# Patient Record
Sex: Female | Born: 1965 | Race: Black or African American | Hispanic: No | Smoking: Never smoker
Health system: Southern US, Community
[De-identification: ages and names within clinical notes are randomized; demographics above are authoritative.]

## PROBLEM LIST (undated history)

## (undated) DIAGNOSIS — M419 Scoliosis, unspecified: Secondary | ICD-10-CM

## (undated) DIAGNOSIS — R87629 Unspecified abnormal cytological findings in specimens from vagina: Secondary | ICD-10-CM

## (undated) HISTORY — DX: Unspecified abnormal cytological findings in specimens from vagina: R87.629

---

## 2008-06-02 HISTORY — PX: ESSURE TUBAL LIGATION: SUR464

## 2009-12-09 ENCOUNTER — Emergency Department (HOSPITAL_COMMUNITY): Admission: EM | Admit: 2009-12-09 | Discharge: 2009-12-09 | Payer: Self-pay | Admitting: Emergency Medicine

## 2010-01-02 ENCOUNTER — Emergency Department (HOSPITAL_COMMUNITY)
Admission: EM | Admit: 2010-01-02 | Discharge: 2010-01-02 | Payer: Self-pay | Source: Home / Self Care | Admitting: Emergency Medicine

## 2010-05-04 ENCOUNTER — Emergency Department (HOSPITAL_COMMUNITY): Payer: Medicaid Other

## 2010-05-04 ENCOUNTER — Emergency Department (HOSPITAL_COMMUNITY)
Admission: EM | Admit: 2010-05-04 | Discharge: 2010-05-04 | Disposition: A | Discharge status: Home / Self Care | Payer: Medicaid Other | Attending: Emergency Medicine | Admitting: Emergency Medicine

## 2010-05-04 DIAGNOSIS — R071 Chest pain on breathing: Secondary | ICD-10-CM | POA: Insufficient documentation

## 2010-05-04 DIAGNOSIS — R0602 Shortness of breath: Secondary | ICD-10-CM | POA: Insufficient documentation

## 2010-05-04 DIAGNOSIS — R42 Dizziness and giddiness: Secondary | ICD-10-CM | POA: Insufficient documentation

## 2010-05-04 DIAGNOSIS — R350 Frequency of micturition: Secondary | ICD-10-CM | POA: Insufficient documentation

## 2010-05-04 DIAGNOSIS — R51 Headache: Secondary | ICD-10-CM | POA: Insufficient documentation

## 2010-05-04 LAB — URINALYSIS, ROUTINE W REFLEX MICROSCOPIC
Bilirubin Urine: NEGATIVE
Glucose, UA: NEGATIVE mg/dL
Hgb urine dipstick: NEGATIVE
Ketones, ur: NEGATIVE mg/dL
pH: 6 (ref 5.0–8.0)

## 2010-05-04 LAB — CBC
HCT: 39.9 % (ref 36.0–46.0)
Hemoglobin: 12.8 g/dL (ref 12.0–15.0)
MCH: 24.8 pg — ABNORMAL LOW (ref 26.0–34.0)
MCHC: 32.1 g/dL (ref 30.0–36.0)
RDW: 13.1 % (ref 11.5–15.5)

## 2010-05-04 LAB — DIFFERENTIAL
Basophils Relative: 0 % (ref 0–1)
Eosinophils Relative: 1 % (ref 0–5)
Lymphocytes Relative: 23 % (ref 12–46)
Monocytes Absolute: 0.6 10*3/uL (ref 0.1–1.0)
Monocytes Relative: 6 % (ref 3–12)
Neutro Abs: 6.6 10*3/uL (ref 1.7–7.7)

## 2010-05-04 LAB — BASIC METABOLIC PANEL
BUN: 12 mg/dL (ref 6–23)
CO2: 29 mEq/L (ref 19–32)
Calcium: 9.8 mg/dL (ref 8.4–10.5)
Chloride: 102 mEq/L (ref 96–112)
Creatinine, Ser: 0.77 mg/dL (ref 0.4–1.2)
Glucose, Bld: 93 mg/dL (ref 70–99)

## 2010-05-04 MED ORDER — IOHEXOL 300 MG/ML  SOLN
100.0000 mL | Freq: Once | INTRAMUSCULAR | Status: AC | PRN
  Administered 2010-05-04: 100 mL via INTRAVENOUS

## 2011-05-06 ENCOUNTER — Other Ambulatory Visit (HOSPITAL_COMMUNITY): Payer: Self-pay | Admitting: Obstetrics

## 2011-05-06 DIAGNOSIS — Z1231 Encounter for screening mammogram for malignant neoplasm of breast: Secondary | ICD-10-CM

## 2011-06-02 ENCOUNTER — Ambulatory Visit (HOSPITAL_COMMUNITY)
Admission: RE | Admit: 2011-06-02 | Discharge: 2011-06-02 | Disposition: A | Discharge status: Home / Self Care | Payer: Medicaid Other | Source: Ambulatory Visit | Attending: Obstetrics | Admitting: Obstetrics

## 2011-06-02 DIAGNOSIS — Z1231 Encounter for screening mammogram for malignant neoplasm of breast: Secondary | ICD-10-CM

## 2011-08-31 ENCOUNTER — Ambulatory Visit: Payer: Medicaid Other | Attending: Orthopedic Surgery | Admitting: Rehabilitative and Restorative Service Providers"

## 2011-08-31 DIAGNOSIS — M545 Low back pain, unspecified: Secondary | ICD-10-CM | POA: Insufficient documentation

## 2011-08-31 DIAGNOSIS — M542 Cervicalgia: Secondary | ICD-10-CM | POA: Insufficient documentation

## 2011-08-31 DIAGNOSIS — IMO0001 Reserved for inherently not codable concepts without codable children: Secondary | ICD-10-CM | POA: Insufficient documentation

## 2011-09-14 ENCOUNTER — Encounter: Payer: Medicaid Other | Admitting: Physical Therapy

## 2011-09-28 ENCOUNTER — Ambulatory Visit: Payer: Medicaid Other | Attending: Orthopedic Surgery | Admitting: Rehabilitative and Restorative Service Providers"

## 2011-09-28 DIAGNOSIS — M542 Cervicalgia: Secondary | ICD-10-CM | POA: Insufficient documentation

## 2011-09-28 DIAGNOSIS — M545 Low back pain, unspecified: Secondary | ICD-10-CM | POA: Insufficient documentation

## 2011-09-28 DIAGNOSIS — IMO0001 Reserved for inherently not codable concepts without codable children: Secondary | ICD-10-CM | POA: Insufficient documentation

## 2011-10-07 ENCOUNTER — Ambulatory Visit: Payer: Medicaid Other | Attending: Orthopedic Surgery | Admitting: Rehabilitative and Restorative Service Providers"

## 2011-10-07 DIAGNOSIS — M545 Low back pain, unspecified: Secondary | ICD-10-CM | POA: Insufficient documentation

## 2011-10-07 DIAGNOSIS — IMO0001 Reserved for inherently not codable concepts without codable children: Secondary | ICD-10-CM | POA: Insufficient documentation

## 2011-10-07 DIAGNOSIS — M542 Cervicalgia: Secondary | ICD-10-CM | POA: Insufficient documentation

## 2011-10-19 ENCOUNTER — Ambulatory Visit: Payer: Medicaid Other | Admitting: Rehabilitative and Restorative Service Providers"

## 2012-04-29 ENCOUNTER — Other Ambulatory Visit (HOSPITAL_COMMUNITY): Payer: Self-pay | Admitting: Obstetrics

## 2012-04-29 ENCOUNTER — Other Ambulatory Visit (HOSPITAL_COMMUNITY): Payer: Self-pay | Admitting: Family Medicine

## 2012-04-29 DIAGNOSIS — Z1231 Encounter for screening mammogram for malignant neoplasm of breast: Secondary | ICD-10-CM

## 2012-06-03 ENCOUNTER — Ambulatory Visit (HOSPITAL_COMMUNITY)
Admission: RE | Admit: 2012-06-03 | Discharge: 2012-06-03 | Disposition: A | Discharge status: Home / Self Care | Payer: Medicaid Other | Source: Ambulatory Visit | Attending: Obstetrics | Admitting: Obstetrics

## 2012-06-03 DIAGNOSIS — Z1231 Encounter for screening mammogram for malignant neoplasm of breast: Secondary | ICD-10-CM

## 2012-06-07 ENCOUNTER — Other Ambulatory Visit: Payer: Self-pay | Admitting: Family Medicine

## 2012-06-07 ENCOUNTER — Other Ambulatory Visit: Payer: Self-pay | Admitting: Obstetrics

## 2012-06-07 DIAGNOSIS — R928 Other abnormal and inconclusive findings on diagnostic imaging of breast: Secondary | ICD-10-CM

## 2012-06-17 ENCOUNTER — Ambulatory Visit
Admission: RE | Admit: 2012-06-17 | Discharge: 2012-06-17 | Disposition: A | Discharge status: Home / Self Care | Payer: Medicaid Other | Source: Ambulatory Visit | Attending: Obstetrics | Admitting: Obstetrics

## 2012-06-17 DIAGNOSIS — R928 Other abnormal and inconclusive findings on diagnostic imaging of breast: Secondary | ICD-10-CM

## 2013-04-05 ENCOUNTER — Emergency Department (HOSPITAL_COMMUNITY)
Admission: EM | Admit: 2013-04-05 | Discharge: 2013-04-05 | Disposition: A | Discharge status: Home / Self Care | Payer: Medicaid Other | Attending: Emergency Medicine | Admitting: Emergency Medicine

## 2013-04-05 ENCOUNTER — Emergency Department (HOSPITAL_COMMUNITY): Payer: Medicaid Other

## 2013-04-05 ENCOUNTER — Encounter (HOSPITAL_COMMUNITY): Payer: Self-pay | Admitting: Emergency Medicine

## 2013-04-05 DIAGNOSIS — R209 Unspecified disturbances of skin sensation: Secondary | ICD-10-CM | POA: Insufficient documentation

## 2013-04-05 DIAGNOSIS — M791 Myalgia, unspecified site: Secondary | ICD-10-CM

## 2013-04-05 DIAGNOSIS — R112 Nausea with vomiting, unspecified: Secondary | ICD-10-CM | POA: Insufficient documentation

## 2013-04-05 DIAGNOSIS — M5412 Radiculopathy, cervical region: Secondary | ICD-10-CM | POA: Insufficient documentation

## 2013-04-05 DIAGNOSIS — IMO0001 Reserved for inherently not codable concepts without codable children: Secondary | ICD-10-CM | POA: Insufficient documentation

## 2013-04-05 DIAGNOSIS — R197 Diarrhea, unspecified: Secondary | ICD-10-CM | POA: Insufficient documentation

## 2013-04-05 DIAGNOSIS — Z791 Long term (current) use of non-steroidal anti-inflammatories (NSAID): Secondary | ICD-10-CM | POA: Insufficient documentation

## 2013-04-05 LAB — COMPREHENSIVE METABOLIC PANEL
ALK PHOS: 57 U/L (ref 39–117)
ALT: 18 U/L (ref 0–35)
AST: 18 U/L (ref 0–37)
Albumin: 4.1 g/dL (ref 3.5–5.2)
BUN: 13 mg/dL (ref 6–23)
CO2: 25 meq/L (ref 19–32)
Calcium: 9.7 mg/dL (ref 8.4–10.5)
Chloride: 102 mEq/L (ref 96–112)
Creatinine, Ser: 0.83 mg/dL (ref 0.50–1.10)
GFR calc non Af Amer: 84 mL/min — ABNORMAL LOW (ref >90)
GLUCOSE: 96 mg/dL (ref 70–99)
POTASSIUM: 4.1 meq/L (ref 3.7–5.3)
SODIUM: 141 meq/L (ref 137–147)
TOTAL PROTEIN: 8.4 g/dL — AB (ref 6.0–8.3)
Total Bilirubin: 0.6 mg/dL (ref 0.3–1.2)

## 2013-04-05 LAB — I-STAT TROPONIN, ED
TROPONIN I, POC: 0 ng/mL (ref 0.00–0.08)
Troponin i, poc: 0 ng/mL (ref 0.00–0.08)

## 2013-04-05 LAB — CBC WITH DIFFERENTIAL/PLATELET
BASOS ABS: 0 10*3/uL (ref 0.0–0.1)
Basophils Relative: 0 % (ref 0–1)
EOS ABS: 0.2 10*3/uL (ref 0.0–0.7)
Eosinophils Relative: 3 % (ref 0–5)
HCT: 40.6 % (ref 36.0–46.0)
Hemoglobin: 13.4 g/dL (ref 12.0–15.0)
LYMPHS ABS: 1.7 10*3/uL (ref 0.7–4.0)
LYMPHS PCT: 35 % (ref 12–46)
MCH: 25.3 pg — ABNORMAL LOW (ref 26.0–34.0)
MCHC: 33 g/dL (ref 30.0–36.0)
MCV: 76.6 fL — ABNORMAL LOW (ref 78.0–100.0)
Monocytes Absolute: 0.5 10*3/uL (ref 0.1–1.0)
Monocytes Relative: 9 % (ref 3–12)
NEUTROS PCT: 53 % (ref 43–77)
Neutro Abs: 2.6 10*3/uL (ref 1.7–7.7)
PLATELETS: 240 10*3/uL (ref 150–400)
RBC: 5.3 MIL/uL — AB (ref 3.87–5.11)
RDW: 13 % (ref 11.5–15.5)
WBC: 5 10*3/uL (ref 4.0–10.5)

## 2013-04-05 MED ORDER — KETOROLAC TROMETHAMINE 60 MG/2ML IM SOLN
60.0000 mg | Freq: Once | INTRAMUSCULAR | Status: AC
  Administered 2013-04-05: 60 mg via INTRAMUSCULAR
  Filled 2013-04-05: qty 2

## 2013-04-05 MED ORDER — CYCLOBENZAPRINE HCL 10 MG PO TABS
5.0000 mg | ORAL_TABLET | Freq: Once | ORAL | Status: AC
  Administered 2013-04-05: 5 mg via ORAL
  Filled 2013-04-05: qty 1

## 2013-04-05 MED ORDER — MELOXICAM 7.5 MG PO TABS: 7.5000 mg | ORAL_TABLET | Freq: Every day | ORAL | Status: DC

## 2013-04-05 MED ORDER — CYCLOBENZAPRINE HCL 10 MG PO TABS: 10.0000 mg | ORAL_TABLET | Freq: Two times a day (BID) | ORAL | Status: DC | PRN

## 2013-04-05 NOTE — Progress Notes (Signed)
VASCULAR LAB PRELIMINARY  PRELIMINARY  PRELIMINARY  PRELIMINARY  Left upper extremity venous duplex completed.    Preliminary report:  Left:  No evidence of DVT or superficial thrombosis.    Murdis Flitton, RVT 04/05/2013, 2:46 PM

## 2013-04-05 NOTE — ED Notes (Signed)
Left arm pain numbness  ( no cp) x 2 days hurts to lift vomited on Monday  No sob  Some neck pain denies injury

## 2013-04-05 NOTE — ED Provider Notes (Signed)
CSN: [id number]     Arrival date & time 04/05/13  4098 History   First MD Initiated Contact with Patient 04/05/13 1003     Chief Complaint  Patient presents with  . Extremity Weakness     (Consider location/radiation/quality/duration/timing/severity/associated sxs/prior Treatment) The history is provided by the patient. No language interpreter was used.  Jillian Casey is a 48 year old female with no known significant past medical history presenting to the emergency department with left upper extremity discomfort that started Monday afternoon. Stated that she started to feel nauseous, reported one episode of emesis late Monday afternoon with diarrhea that occurred late Monday afternoon and also yesterday. Patient reported that her pain starts at the left side the neck and radiates down to her digits of the left hand described as a sharp pain. Stated that she's noticed mild swelling to the left upper extremity with continuous tingling and numbness radiating from the left side of the neck down to her digits. Stated that she's used nothing for the pain. Reported that she recently came back from [place] on Monday. Denied injury, falls, sudden loss of vision, visual distortions, chest pain, difficulty breathing, shortness of breath, abdominal pain, headache, dizziness, syncope, weakness/tingling to lower legs. PCP Dr. Ronne Binning  History reviewed. No pertinent past medical history. History reviewed. No pertinent past surgical history. No family history on file. History  Substance Use Topics  . Smoking status: Never Smoker   . Smokeless tobacco: Not on file  . Alcohol Use: Yes   OB History   Grav Para Term Preterm Abortions TAB SAB Ect Mult Living                 Review of Systems  Constitutional: Negative for fever and chills.  Respiratory: Negative for chest tightness and shortness of breath.   Cardiovascular: Negative for chest pain.  Gastrointestinal: Positive for nausea,  vomiting and diarrhea. Negative for abdominal pain.  Musculoskeletal: Positive for arthralgias (Left upper extremity), myalgias (Left upper extremity) and neck pain. Negative for back pain.  Neurological: Negative for dizziness and weakness.  All other systems reviewed and are negative.      Allergies  Review of patient's allergies indicates no known allergies.  Home Medications   Current Outpatient Rx  Name  Route  Sig  Dispense  Refill  . cyclobenzaprine (FLEXERIL) 10 MG tablet   Oral   Take 1 tablet (10 mg total) by mouth 2 (two) times daily as needed for muscle spasms.   20 tablet   0   . meloxicam (MOBIC) 7.5 MG tablet   Oral   Take 1 tablet (7.5 mg total) by mouth daily.   15 tablet   0    BP 122/88  Pulse 68  Temp(Src) 98.1 F (36.7 C) (Oral)  Resp 16  Ht 5\' 1"  (1.549 m)  Wt 128 lb 6.4 oz (58.242 kg)  BMI 24.27 kg/m2  SpO2 100%  LMP 03/17/2013 Physical Exam  Nursing note and vitals reviewed. Constitutional: She is oriented to person, place, and time. She appears well-developed and well-nourished. No distress.  HENT:  Head: Normocephalic and atraumatic.  Mouth/Throat: Oropharynx is clear and moist. No oropharyngeal exudate.  Eyes: Conjunctivae and EOM are normal. Pupils are equal, round, and reactive to light. Right eye exhibits no discharge. Left eye exhibits no discharge.  Neck: Normal range of motion. Neck supple. No tracheal deviation present.    Negative swelling, deformities noted to the neck Negative neck stiffness Negative nuchal rigidity Discomfort upon palpation  to the left side the neck - muscular nature  Cardiovascular: Normal rate, regular rhythm and normal heart sounds.  Exam reveals no friction rub.   No murmur heard. Pulses:      Radial pulses are 2+ on the right side, and 2+ on the left side.       Dorsalis pedis pulses are 2+ on the right side, and 2+ on the left side.  Pulmonary/Chest: Effort normal and breath sounds normal. No  respiratory distress. She has no wheezes. She has no rales.  Patient is able to speak in full sentences without difficulty Negative stridor Negative use of accessory muscles Negative respiratory distress  Musculoskeletal: Normal range of motion. She exhibits tenderness.       Arms: Negative swelling, erythema, inflammation, lesions, sores, streaks, deformities noted to the left upper extremity. Full range of motion to the left shoulder, left elbow, left wrist and digits of the left hand without difficulty noted. Discomfort upon palpation to the left upper extremity circumferentially.  Lymphadenopathy:    She has no cervical adenopathy.  Neurological: She is alert and oriented to person, place, and time. No cranial nerve deficit. She exhibits normal muscle tone. Coordination normal.  Cranial nerves III-XII grossly intact Strength 5+/5+ to upper and lower extremities bilaterally with resistance applied, equal distribution noted Strength intact to MCP, PIP, DIP joints of left hand Patient is able to bring finger to nose bilaterally without difficulty or ataxia Negative arm drift Fine motor skills intact Sensation intact with differentiation to sharp and dull touch Gait proper, proper balance - negative sway, negative drift, negative step-offs  Skin: Skin is warm and dry. She is not diaphoretic. No erythema.  Psychiatric: She has a normal mood and affect. Her behavior is normal. Thought content normal.    ED Course  Procedures (including critical care time)  Results for orders placed during the hospital encounter of 04/05/13  CBC WITH DIFFERENTIAL      Result Value Ref Range   WBC 5.0  4.0 - 10.5 K/uL   RBC 5.30 (*) 3.87 - 5.11 MIL/uL   Hemoglobin 13.4  12.0 - 15.0 g/dL   HCT 16.140.6  09.636.0 - 04.546.0 %   MCV 76.6 (*) 78.0 - 100.0 fL   MCH 25.3 (*) 26.0 - 34.0 pg   MCHC 33.0  30.0 - 36.0 g/dL   RDW 40.913.0  81.111.5 - 91.415.5 %   Platelets 240  150 - 400 K/uL   Neutrophils Relative % 53  43 - 77 %    Neutro Abs 2.6  1.7 - 7.7 K/uL   Lymphocytes Relative 35  12 - 46 %   Lymphs Abs 1.7  0.7 - 4.0 K/uL   Monocytes Relative 9  3 - 12 %   Monocytes Absolute 0.5  0.1 - 1.0 K/uL   Eosinophils Relative 3  0 - 5 %   Eosinophils Absolute 0.2  0.0 - 0.7 K/uL   Basophils Relative 0  0 - 1 %   Basophils Absolute 0.0  0.0 - 0.1 K/uL  COMPREHENSIVE METABOLIC PANEL      Result Value Ref Range   Sodium 141  137 - 147 mEq/L   Potassium 4.1  3.7 - 5.3 mEq/L   Chloride 102  96 - 112 mEq/L   CO2 25  19 - 32 mEq/L   Glucose, Bld 96  70 - 99 mg/dL   BUN 13  6 - 23 mg/dL   Creatinine, Ser 7.820.83  0.50 - 1.10 mg/dL  Calcium 9.7  8.4 - 10.5 mg/dL   Total Protein 8.4 (*) 6.0 - 8.3 g/dL   Albumin 4.1  3.5 - 5.2 g/dL   AST 18  0 - 37 U/L   ALT 18  0 - 35 U/L   Alkaline Phosphatase 57  39 - 117 U/L   Total Bilirubin 0.6  0.3 - 1.2 mg/dL   GFR calc non Af Amer 84 (*) >90 mL/min   GFR calc Af Amer >90  >90 mL/min  I-STAT TROPOININ, ED      Result Value Ref Range   Troponin i, poc 0.00  0.00 - 0.08 ng/mL   Comment 3           I-STAT TROPOININ, ED      Result Value Ref Range   Troponin i, poc 0.00  0.00 - 0.08 ng/mL   Comment 3            Dg Cervical Spine Complete  04/05/2013   CLINICAL DATA:  Pain with radicular symptoms  EXAM: CERVICAL SPINE  4+ VIEWS  COMPARISON:  None.  FINDINGS: Frontal, lateral, open-mouth odontoid, and bilateral oblique views were obtained. There is no fracture or spondylolisthesis. Prevertebral soft tissues and predental space regions are normal.  There is moderate disc space narrowing at C4-5, C5-6, and C6-7. There are anterior osteophytes at C4, C5, C6, and C7. There is slight exit foraminal narrowing at C3-4, C4-5, C5-6, and C6-7 bilaterally. There is relative lack of lordosis.  IMPRESSION: Multilevel osteoarthritic change. No fracture or spondylolisthesis. Lack of lordosis is felt to be indicative of muscle spasm. If there is concern for ligamentous injury, however, lateral  flexion-extension views could be helpful to further assess.   Electronically Signed   By: Bretta Bang M.D.   On: 04/05/2013 11:25   Labs Review Labs Reviewed  CBC WITH DIFFERENTIAL - Abnormal; Notable for the following:    RBC 5.30 (*)    MCV 76.6 (*)    MCH 25.3 (*)    All other components within normal limits  COMPREHENSIVE METABOLIC PANEL - Abnormal; Notable for the following:    Total Protein 8.4 (*)    GFR calc non Af Amer 84 (*)    All other components within normal limits  I-STAT TROPOININ, ED  I-STAT TROPOININ, ED   Imaging Review Dg Cervical Spine Complete  04/05/2013   CLINICAL DATA:  Pain with radicular symptoms  EXAM: CERVICAL SPINE  4+ VIEWS  COMPARISON:  None.  FINDINGS: Frontal, lateral, open-mouth odontoid, and bilateral oblique views were obtained. There is no fracture or spondylolisthesis. Prevertebral soft tissues and predental space regions are normal.  There is moderate disc space narrowing at C4-5, C5-6, and C6-7. There are anterior osteophytes at C4, C5, C6, and C7. There is slight exit foraminal narrowing at C3-4, C4-5, C5-6, and C6-7 bilaterally. There is relative lack of lordosis.  IMPRESSION: Multilevel osteoarthritic change. No fracture or spondylolisthesis. Lack of lordosis is felt to be indicative of muscle spasm. If there is concern for ligamentous injury, however, lateral flexion-extension views could be helpful to further assess.   Electronically Signed   By: Bretta Bang M.D.   On: 04/05/2013 11:25     EKG Interpretation   Date/Time:  Wednesday April 05 2013 08:35:00 EST Ventricular Rate:  82 PR Interval:  152 QRS Duration: 80 QT Interval:  364 QTC Calculation: 425 R Axis:   74 Text Interpretation:  Normal sinus rhythm Normal ECG No significant change  since last  tracing 04 May 2010 Confirmed by [facility]  MD-I, IVA ([id number]) on  04/05/2013 12:19:01 PM      MDM   Final diagnoses:  Cervical radiculopathy  Muscular pain   Medications    ketorolac (TORADOL) injection 60 mg (60 mg Intramuscular Given 04/05/13 1121)  cyclobenzaprine (FLEXERIL) tablet 5 mg (5 mg Oral Given 04/05/13 1121)   Filed Vitals:   04/05/13 0826 04/05/13 1009 04/05/13 1200  BP: 128/91 132/85 122/88  Pulse: 80 67 68  Temp: 98.1 F (36.7 C)    TempSrc: Oral    Resp: 18 16   Height: 5\' 1"  (1.549 m)    Weight: 128 lb 6.4 oz (58.242 kg)    SpO2: 98% 100% 100%    Patient presenting to emergency department with left upper extremity the discomfort that started on Monday. Reported that the pain radiates from her left side of the neck down to the digits of the left hand. Reported that there is constant numbness and tingling radiating from the left side of the neck down to the digits of the left hand. Reported nausea, vomiting episode on Monday x1 with diarrhea all day yesterday. Denied injury, chest pain, shortness of breath, difficulty breathing. Alert and oriented. GCS 15. Heart rate and rhythm normal. Lungs clear to auscultation to upper lower lobes bilaterally. Radial and DP pulses 2+ bilaterally. Cap refill less than 3 seconds. Negative swelling or pitting edema localized to lower extremities bilaterally. Negative deformities noted to the neck-discomfort upon palpation to the left musculature of the neck. Full range of motion to the left upper tremor knee without difficulty. Discomfort upon palpation to the left upper extremity circumferentially-negative deformities identified-negative swelling, erythema, streaks. Strength intact MCP, PIP, DIP joints of the left hand. Strength intact to upper and lower extremities bilaterally with equal distribution. Equal grip strength noted. Sensation intact with differentiation to sharp and dull touch. EKG noted normal sinus rhythm the heart rate of 82 beats per minute - negative ischemic findings, no change noted since last tracing in April 2012. I-STAT troponin negative elevation. Second i-STAT troponin negative elevation. CBC  negative findings. CMP negative findings. Plain film of cervical spine noted multilevel osteoarthritic change - negative acute osseous injury or spondylolisthesis. Doppler of left upper extremity negative for superficial thrombosis or DVT. Doubt cardiac issue. Doubt stroke. Negative focal neurological deficits noted. Suspicion to be cervical radiculopathy secondary to findings on the cervical spine plain film and presentation patient. Suspicion to be muscular in nature secondary to pain with motion and pain upon palpation. Patient responded well to medications. Pain improved in ED setting. Patient stable, afebrile. Discharged patient. Discharged patient with muscle relaxer and anti-inflammatory. Discussed with patient to rest and stay hydrated. Discussed with patient to avoid any physical or strenuous activity. Discussed with patient to apply heat compressions and educated patient on proper stretching techniques. Referred patient to primary care provider and orthopedics. Discussed with patient to closely monitor symptoms and if symptoms are to worsen or change to report back to the ED - strict return instructions given.  Patient agreed to plan of care, understood, all questions answered.   Raymon Mutton, PA-C 04/05/13 2059

## 2013-04-05 NOTE — Discharge Instructions (Signed)
Please call your doctor for a followup appointment within 24-48 hours. When you talk to your doctor please let them know that you were seen in the emergency department and have them acquire all of your records so that they can discuss the findings with you and formulate a treatment plan to fully care for your new and ongoing problems. Please call and set-up an appointment with your primary care provider to be re-assessed Please call and set-up an appointment with Orthopedics to be re-assessed Please rest and stay hydrated Please take medications as prescribed Please avoid any physical or strenuous activity Please perform stretching with slow circular motions of the arm  Please apply warm compressions and massage with icy hot ointment to aid in relief Please continue to monitor symptoms closely and if symptoms are to worsen or change (fever greater than 101, chills, sweating, nausea, vomiting, chest pain, shortness of breathe, difficulty breathing, abdominal pain, swelling to the arm, numbness, tingling, neck pain, fall, injury, redness, streaking, warmth upon palpation to the left arm) please report back to the ED immediately   Cervical Radiculopathy Cervical radiculopathy means a nerve in the neck is pinched or bruised. This can cause pain or loss of feeling (numbness) that runs from your neck to your arm and fingers. HOME CARE   Put ice on the injured or painful area.  Put ice in a plastic bag.  Place a towel between your skin and the bag.  Leave the ice on for 15-20 minutes, 03-04 times a day, or as told by your doctor.  If ice does not help, you can try using heat. Take a warm shower or bath, or use a hot water bottle as told by your doctor.  You may try a gentle neck and shoulder massage.  Use a flat pillow when you sleep.  Only take medicines as told by your doctor.  Keep all physical therapy visits as told by your doctor.  If you are given a soft collar, wear it as told by  your doctor. GET HELP RIGHT AWAY IF:   Your pain gets worse and is not controlled with medicine.  You lose feeling or feel weak in your hand, arm, face, or leg.  You have a fever or stiff neck.  You cannot control when you poop or pee (incontinence).  You have trouble with walking, balance, or speaking. MAKE SURE YOU:   Understand these instructions.  Will watch your condition.  Will get help right away if you are not doing well or get worse. Document Released: 01/08/2011 Document Revised: 04/13/2011 Document Reviewed: 01/08/2011 [facility] Patient Information 2014 [place], [place].

## 2013-04-06 NOTE — ED Provider Notes (Signed)
Medical screening examination/treatment/procedure(s) were performed by non-physician practitioner and as supervising physician I was immediately available for consultation/collaboration.   EKG Interpretation   Date/Time:  Wednesday April 05 2013 08:35:00 EST Ventricular Rate:  82 PR Interval:  152 QRS Duration: 80 QT Interval:  364 QTC Calculation: 425 R Axis:   74 Text Interpretation:  Normal sinus rhythm Normal ECG No significant change  since last tracing 04 May 2010 Confirmed by [facility]  MD-I, Lissy Deuser ([id number]) on  04/05/2013 12:19:01 PM      Devoria AlbeIva Dorrie Cocuzza, MD, Franz DellFACEP   Clarity Ciszek L Micajah Dennin, MD 04/06/13 [phone]

## 2013-05-17 ENCOUNTER — Other Ambulatory Visit: Payer: Self-pay

## 2013-05-17 DIAGNOSIS — Z1231 Encounter for screening mammogram for malignant neoplasm of breast: Secondary | ICD-10-CM

## 2013-06-05 ENCOUNTER — Other Ambulatory Visit: Payer: Self-pay

## 2013-06-05 ENCOUNTER — Ambulatory Visit
Admission: RE | Admit: 2013-06-05 | Discharge: 2013-06-05 | Disposition: A | Discharge status: Home / Self Care | Payer: Medicaid Other | Source: Ambulatory Visit

## 2013-06-05 ENCOUNTER — Other Ambulatory Visit: Payer: Self-pay | Admitting: Obstetrics

## 2013-06-05 ENCOUNTER — Other Ambulatory Visit: Payer: Self-pay | Admitting: Family Medicine

## 2013-06-05 ENCOUNTER — Encounter (INDEPENDENT_AMBULATORY_CARE_PROVIDER_SITE_OTHER): Payer: Self-pay

## 2013-06-05 DIAGNOSIS — Z1231 Encounter for screening mammogram for malignant neoplasm of breast: Secondary | ICD-10-CM

## 2013-06-05 DIAGNOSIS — N644 Mastodynia: Secondary | ICD-10-CM

## 2013-06-13 ENCOUNTER — Other Ambulatory Visit: Payer: Self-pay | Admitting: Obstetrics

## 2013-06-13 DIAGNOSIS — N644 Mastodynia: Secondary | ICD-10-CM

## 2013-06-23 ENCOUNTER — Other Ambulatory Visit: Payer: Self-pay | Admitting: Obstetrics

## 2013-06-23 DIAGNOSIS — N644 Mastodynia: Secondary | ICD-10-CM

## 2013-06-30 ENCOUNTER — Ambulatory Visit
Admission: RE | Admit: 2013-06-30 | Discharge: 2013-06-30 | Disposition: A | Discharge status: Home / Self Care | Payer: Medicaid Other | Source: Ambulatory Visit | Attending: Family Medicine | Admitting: Family Medicine

## 2013-06-30 ENCOUNTER — Ambulatory Visit
Admission: RE | Admit: 2013-06-30 | Discharge: 2013-06-30 | Disposition: A | Discharge status: Home / Self Care | Payer: Medicaid Other | Source: Ambulatory Visit | Attending: Obstetrics | Admitting: Obstetrics

## 2013-06-30 DIAGNOSIS — N644 Mastodynia: Secondary | ICD-10-CM

## 2013-12-11 ENCOUNTER — Emergency Department (HOSPITAL_COMMUNITY)
Admission: EM | Admit: 2013-12-11 | Discharge: 2013-12-11 | Disposition: A | Discharge status: Home / Self Care | Payer: Medicaid Other | Attending: Emergency Medicine | Admitting: Emergency Medicine

## 2013-12-11 ENCOUNTER — Encounter (HOSPITAL_COMMUNITY): Payer: Self-pay | Admitting: Emergency Medicine

## 2013-12-11 DIAGNOSIS — M25551 Pain in right hip: Secondary | ICD-10-CM

## 2013-12-11 DIAGNOSIS — Z79899 Other long term (current) drug therapy: Secondary | ICD-10-CM | POA: Insufficient documentation

## 2013-12-11 DIAGNOSIS — Z791 Long term (current) use of non-steroidal anti-inflammatories (NSAID): Secondary | ICD-10-CM | POA: Insufficient documentation

## 2013-12-11 HISTORY — DX: Scoliosis, unspecified: M41.9

## 2013-12-11 MED ORDER — NAPROXEN 500 MG PO TABS: 500.0000 mg | ORAL_TABLET | Freq: Two times a day (BID) | ORAL | Status: DC

## 2013-12-11 MED ORDER — METHOCARBAMOL 500 MG PO TABS: 500.0000 mg | ORAL_TABLET | Freq: Two times a day (BID) | ORAL | Status: AC | PRN

## 2013-12-11 MED ORDER — KETOROLAC TROMETHAMINE 60 MG/2ML IM SOLN
60.0000 mg | Freq: Once | INTRAMUSCULAR | Status: AC
  Administered 2013-12-11: 60 mg via INTRAMUSCULAR
  Filled 2013-12-11: qty 2

## 2013-12-11 NOTE — ED Provider Notes (Signed)
The patient is a 48 year old female who has had approximately one week of right buttock and hip pain which radiates up into her right lower back. This is worse with ambulation and worse with movement of the right leg, it is improved when she lays flat and when she rests at night. She has no associated fevers, chills, nausea, vomiting and has no dysuria, diarrhea, history of IV drug use or cancer. On exam the patient has no reproducible tenderness to her back however with manipulation of her leg I'm able to make her feel the pain in her buttock and her back. She has no other abdominal tenderness, normal strength at the hip and knee and ankle and normal sensation of the right lower extremity.  This pain is likely musculoskeletal, she does not have any signs of appendicitis, pyelonephritis or urinary tract abnormalities. She is improved significantly after the medication she was given in the emergency department and appears stable for discharge.  Meds given in ED:  Medications  ketorolac (TORADOL) injection 60 mg (60 mg Intramuscular Given 12/11/13 2133)      Medical screening examination/treatment/procedure(s) were conducted as a shared visit with non-physician practitioner(s) and myself.  I personally evaluated the patient during the encounter.  Clinical Impression:   Final diagnoses:  Hip pain, acute, right         Vida RollerBrian D Azrielle Springsteen, MD 12/12/13 [phone]

## 2013-12-11 NOTE — Discharge Instructions (Signed)
Hip Pain Your hip is the joint between your upper legs and your lower pelvis. The bones, cartilage, tendons, and muscles of your hip joint perform a lot of work each day supporting your body weight and allowing you to move around. Hip pain can range from a minor ache to severe pain in one or both of your hips. Pain may be felt on the inside of the hip joint near the groin, or the outside near the buttocks and upper thigh. You may have swelling or stiffness as well.  HOME CARE INSTRUCTIONS   Take medicines only as directed by your health care provider.  Apply ice to the injured area:  Put ice in a plastic bag.  Place a towel between your skin and the bag.  Leave the ice on for 15-20 minutes at a time, 3-4 times a day.  Keep your leg raised (elevated) when possible to lessen swelling.  Avoid activities that cause pain.  Follow specific exercises as directed by your health care provider.  Sleep with a pillow between your legs on your most comfortable side.  Record how often you have hip pain, the location of the pain, and what it feels like. SEEK MEDICAL CARE IF:   You are unable to put weight on your leg.  Your hip is red or swollen or very tender to touch.  Your pain or swelling continues or worsens after 1 week.  You have increasing difficulty walking.  You have a fever. SEEK IMMEDIATE MEDICAL CARE IF:   You have fallen.  You have a sudden increase in pain and swelling in your hip. MAKE SURE YOU:   Understand these instructions.  Will watch your condition.  Will get help right away if you are not doing well or get worse. Document Released: 07/09/2009 Document Revised: 06/05/2013 Document Reviewed: 09/15/2012 Exit[employer] Patient Information 2015 [employer]. This information is not intended to replace advice given to you by your health care provider. Make sure you discuss any questions you have with your health care provider.  

## 2013-12-11 NOTE — ED Provider Notes (Signed)
CSN: [id number]     Arrival date & time 12/11/13  1957 History   First MD Initiated Contact with Patient 12/11/13 2104     Chief Complaint  Patient presents with  . Hip Pain   Jillian Casey is a 48 y.o. female who presents to the ED complaining of right hip pain for the past 7 days. She reports this pain in her hip radiates to her right lower back and into her buttocks. She rates the pain at a 9 out of 10 and describes it as stabbing. The pain is worse when sitting, twisting or walking. She reports it is better when lying down. She reports sitting Flexeril over the past 2 days without relief. She attempted no other treatments. She denies trauma to her back or hip. She denies falling. She denies IV drug use or history of cancer. She denies weakness, numbness, tingling, loss of bowel or bladder control. She denies fevers, chills, abdominal pain, nausea, dysuria, hematuria.  (Consider location/radiation/quality/duration/timing/severity/associated sxs/prior Treatment) Patient is a 48 y.o. female presenting with hip pain. The history is provided by the patient.  Hip Pain This is a new problem. The current episode started 1 to 4 weeks ago. The problem occurs constantly. The problem has been gradually worsening. Pertinent negatives include no abdominal pain, anorexia, change in bowel habit, chest pain, chills, coughing, diaphoresis, fatigue, fever, headaches, joint swelling, nausea, neck pain, numbness, rash, sore throat, swollen glands, urinary symptoms, vertigo, visual change, vomiting or weakness. The symptoms are aggravated by twisting. Treatments tried: flexeril. The treatment provided mild relief.    Past Medical History  Diagnosis Date  . Scoliosis    History reviewed. No pertinent past surgical history. Family History  Problem Relation Age of Onset  . Hyperlipidemia Father   . Hypertension Other   . Diabetes Other    History  Substance Use Topics  . Smoking status: Never Smoker    . Smokeless tobacco: Not on file  . Alcohol Use: Yes     Comment: occ   OB History    No data available     Review of Systems  Constitutional: Negative for fever, chills, diaphoresis, appetite change and fatigue.  HENT: Negative for ear pain, rhinorrhea, sinus pressure and sore throat.   Eyes: Negative for pain.  Respiratory: Negative for cough, shortness of breath and wheezing.   Cardiovascular: Negative for chest pain, palpitations and leg swelling.  Gastrointestinal: Negative for nausea, vomiting, abdominal pain, diarrhea, anorexia and change in bowel habit.  Genitourinary: Negative for dysuria, urgency, frequency, hematuria, difficulty urinating and pelvic pain.  Musculoskeletal: Negative for joint swelling, gait problem, neck pain and neck stiffness.  Skin: Negative for rash and wound.  Neurological: Negative for dizziness, vertigo, weakness, light-headedness, numbness and headaches.  All other systems reviewed and are negative.     Allergies  Review of patient's allergies indicates no known allergies.  Home Medications   Prior to Admission medications   Medication Sig Start Date End Date Taking? Authorizing Provider  cyclobenzaprine (FLEXERIL) 10 MG tablet Take 1 tablet (10 mg total) by mouth 2 (two) times daily as needed for muscle spasms. 04/05/13  Yes Marissa Sciacca, PA-C  Menthol, Topical Analgesic, (ICY HOT) 5 % PADS Apply 1 each topically daily.   Yes Historical Provider, MD  meloxicam (MOBIC) 7.5 MG tablet Take 1 tablet (7.5 mg total) by mouth daily. 04/05/13   Marissa Sciacca, PA-C  methocarbamol (ROBAXIN) 500 MG tablet Take 1 tablet (500 mg total) by mouth 2 (two)  times daily as needed for muscle spasms. 12/11/13   Lawana ChambersWilliam Duncan Deontra Pereyra, PA  naproxen (NAPROSYN) 500 MG tablet Take 1 tablet (500 mg total) by mouth 2 (two) times daily with a meal. 12/11/13   Einar GipWilliam Duncan Benjamyn Hestand, PA   BP 132/78 mmHg  Pulse 58  Temp(Src) 98.1 F (36.7 C) (Oral)  Resp 16  Ht 5\' 2"   (1.575 m)  Wt 130 lb (58.968 kg)  BMI 23.77 kg/m2  SpO2 100%  LMP 12/06/2013 (Exact Date) Physical Exam  Constitutional: She is oriented to person, place, and time. She appears well-developed and well-nourished. No distress.  HENT:  Head: Normocephalic and atraumatic.  Mouth/Throat: Oropharynx is clear and moist.  Eyes: Conjunctivae are normal. Pupils are equal, round, and reactive to light. Right eye exhibits no discharge. Left eye exhibits no discharge.  Neck: Neck supple.  Cardiovascular: Normal rate, regular rhythm, normal heart sounds and intact distal pulses.  Exam reveals no gallop and no friction rub.   No murmur heard. Bilateral posterior tibialis pulses intact  Pulmonary/Chest: Effort normal and breath sounds normal. No respiratory distress. She has no wheezes. She has no rales.  Abdominal: Soft. There is no tenderness.  Musculoskeletal: Normal range of motion. She exhibits no edema.  Strength 5/5 in her bilateral upper and lower extremities. Bilateral patellar DTRs intact. Right hip and buttocks tender to palpation. Right paraspinous muscles in spasm. No bony tenderness.   Lymphadenopathy:    She has no cervical adenopathy.  Neurological: She is alert and oriented to person, place, and time. She has normal reflexes. She displays normal reflexes. Coordination normal.  Strength 5/5 in her bilateral upper and lower extremities. Patellar DTRs intact bilaterally. Sensation in her LE intact bilaterally.  Skin: Skin is warm and dry. No rash noted. She is not diaphoretic. No erythema. No pallor.  Psychiatric: She has a normal mood and affect. Her behavior is normal.  Nursing note and vitals reviewed.   ED Course  Procedures (including critical care time) Labs Review Labs Reviewed - No data to display  Imaging Review No results found.   EKG Interpretation None      Filed Vitals:   12/11/13 2014 12/11/13 2316  BP: 131/99 132/78  Pulse: 77 58  Temp: 98.1 F (36.7 C)    TempSrc: Oral   Resp: 18 16  Height: 5\' 2"  (1.575 m)   Weight: 130 lb (58.968 kg)   SpO2: 100% 100%     MDM   Meds given in ED:  Medications  ketorolac (TORADOL) injection 60 mg (60 mg Intramuscular Given 12/11/13 2133)    Discharge Medication List as of 12/11/2013 11:12 PM    START taking these medications   Details  methocarbamol (ROBAXIN) 500 MG tablet Take 1 tablet (500 mg total) by mouth 2 (two) times daily as needed for muscle spasms., Starting 12/11/2013, Until Discontinued, Print    naproxen (NAPROSYN) 500 MG tablet Take 1 tablet (500 mg total) by mouth 2 (two) times daily with a meal., Starting 12/11/2013, Until Discontinued, Print        Final diagnoses:  Hip pain, acute, right   Jillian Casey is a 48 y.o. female presents with 7 days of right hip pain that radiates into her right lower back and her buttocks. Pain is reproducible with palpation. She denies numbness, tingling or loss of bowel or bladder control. She has no history of cancer. No history of trauma to her back. Her bilateral patellar DTRs are intact. Her abdomen is nontender to  palpation. She denies urinary symptoms. Patient is able to ambulate without assistance or difficulty. Patient reports her pain is improved with Toradol given in the ED. Now rates her pain at 6 out of 10. No muscle relaxants were given in the ED due to the fact that she will be driving home tonight. I discharged the patient with a prescription for naproxen and Robaxin. Advised patient use caution when taking Robaxin as a can cause drowsiness. I advised patient to follow-up with her primary care physician within the next 3 days. I advised the patient to return to the ED with new or worsening symptoms or new concerns. Patient verbalized understanding and agreement with plan. Patient was discussed with and evaluated by Dr. Eber Hong who agrees with assessment and plan.     Lawana Chambers, PA 12/12/13 7829  Vida Roller,  MD 12/12/13 0930

## 2013-12-11 NOTE — ED Notes (Signed)
Pt states she has taken flexeril for the past 2 nights but it has not helped

## 2013-12-11 NOTE — ED Notes (Signed)
Pt states for the last week she has been having pain in her right side and hip area  Pt denies injury  Pt states it hurts to touch and move

## 2013-12-13 ENCOUNTER — Encounter (HOSPITAL_COMMUNITY): Payer: Self-pay | Admitting: Emergency Medicine

## 2013-12-13 ENCOUNTER — Emergency Department (INDEPENDENT_AMBULATORY_CARE_PROVIDER_SITE_OTHER)
Admission: EM | Admit: 2013-12-13 | Discharge: 2013-12-13 | Disposition: A | Discharge status: Home / Self Care | Payer: Self-pay | Source: Home / Self Care | Attending: Emergency Medicine | Admitting: Emergency Medicine

## 2013-12-13 ENCOUNTER — Ambulatory Visit (HOSPITAL_COMMUNITY): Payer: Self-pay | Attending: Emergency Medicine

## 2013-12-13 DIAGNOSIS — M419 Scoliosis, unspecified: Secondary | ICD-10-CM | POA: Insufficient documentation

## 2013-12-13 DIAGNOSIS — Z79899 Other long term (current) drug therapy: Secondary | ICD-10-CM | POA: Insufficient documentation

## 2013-12-13 DIAGNOSIS — M5410 Radiculopathy, site unspecified: Secondary | ICD-10-CM

## 2013-12-13 DIAGNOSIS — M25551 Pain in right hip: Secondary | ICD-10-CM

## 2013-12-13 DIAGNOSIS — M549 Dorsalgia, unspecified: Secondary | ICD-10-CM | POA: Insufficient documentation

## 2013-12-13 LAB — POCT URINALYSIS DIP (DEVICE)
BILIRUBIN URINE: NEGATIVE
GLUCOSE, UA: NEGATIVE mg/dL
Hgb urine dipstick: NEGATIVE
KETONES UR: NEGATIVE mg/dL
LEUKOCYTES UA: NEGATIVE
Nitrite: NEGATIVE
PH: 5.5 (ref 5.0–8.0)
Protein, ur: NEGATIVE mg/dL
Specific Gravity, Urine: >=1.03 (ref 1.005–1.030)
Urobilinogen, UA: 0.2 mg/dL (ref 0.0–1.0)

## 2013-12-13 MED ORDER — KETOROLAC TROMETHAMINE 60 MG/2ML IM SOLN
60.0000 mg | Freq: Once | INTRAMUSCULAR | Status: AC
  Administered 2013-12-13: 60 mg via INTRAMUSCULAR

## 2013-12-13 MED ORDER — ETODOLAC 500 MG PO TABS: 500.0000 mg | ORAL_TABLET | Freq: Two times a day (BID) | ORAL | Status: AC

## 2013-12-13 MED ORDER — KETOROLAC TROMETHAMINE 60 MG/2ML IM SOLN
INTRAMUSCULAR | Status: AC
  Filled 2013-12-13: qty 2

## 2013-12-13 MED ORDER — OXYCODONE-ACETAMINOPHEN 5-325 MG PO TABS: 1.0000 | ORAL_TABLET | ORAL | Status: DC | PRN

## 2013-12-13 NOTE — ED Notes (Signed)
C/o right hip pain onset 1.5 weeks [facility]; given Ibup 800 and Robaxin w/no relief Denies inj/trauma; hx of scoliosis  Steady gait Alert, no signs of acute distress.

## 2013-12-13 NOTE — Discharge Instructions (Signed)
Back Pain, Adult °Back pain is very common. The pain often gets better over time. The cause of back pain is usually not dangerous. Most people can learn to manage their back pain on their own.  °HOME CARE  °· Stay active. Start with short walks on flat ground if you can. Try to walk farther each day. °· Do not sit, drive, or stand in one place for more than 30 minutes. Do not stay in bed. °· Do not avoid exercise or work. Activity can help your back heal faster. °· Be careful when you bend or lift an object. Bend at your knees, keep the object close to you, and do not twist. °· Sleep on a firm mattress. Lie on your side, and bend your knees. If you lie on your back, put a pillow under your knees. °· Only take medicines as told by your doctor. °· Put ice on the injured area. °¨ Put ice in a plastic bag. °¨ Place a towel between your skin and the bag. °¨ Leave the ice on for 15-20 minutes, 03-04 times a day for the first 2 to 3 days. After that, you can switch between ice and heat packs. °· Ask your doctor about back exercises or massage. °· Avoid feeling anxious or stressed. Find good ways to deal with stress, such as exercise. °GET HELP RIGHT AWAY IF:  °· Your pain does not go away with rest or medicine. °· Your pain does not go away in 1 week. °· You have new problems. °· You do not feel well. °· The pain spreads into your legs. °· You cannot control when you poop (bowel movement) or pee (urinate). °· Your arms or legs feel weak or lose feeling (numbness). °· You feel sick to your stomach (nauseous) or throw up (vomit). °· You have belly (abdominal) pain. °· You feel like you may pass out (faint). °MAKE SURE YOU:  °· Understand these instructions. °· Will watch your condition. °· Will get help right away if you are not doing well or get worse. °Document Released: 07/08/2007 Document Revised: 04/13/2011 Document Reviewed: 05/23/2013 °Exit[employer]® Patient Information ©2015 [employer]. This information is not intended  to replace advice given to you by your health care provider. Make sure you discuss any questions you have with your health care provider. ° °Hip Pain °Your hip is the joint between your upper legs and your lower pelvis. The bones, cartilage, tendons, and muscles of your hip joint perform a lot of work each day supporting your body weight and allowing you to move around. °Hip pain can range from a minor ache to severe pain in one or both of your hips. Pain may be felt on the inside of the hip joint near the groin, or the outside near the buttocks and upper thigh. You may have swelling or stiffness as well.  °HOME CARE INSTRUCTIONS  °· Take medicines only as directed by your health care provider. °· Apply ice to the injured area: °¨ Put ice in a plastic bag. °¨ Place a towel between your skin and the bag. °¨ Leave the ice on for 15-20 minutes at a time, 3-4 times a day. °· Keep your leg raised (elevated) when possible to lessen swelling. °· Avoid activities that cause pain. °· Follow specific exercises as directed by your health care provider. °· Sleep with a pillow between your legs on your most comfortable side. °· Record how often you have hip pain, the location of the pain, and   what it feels like. °SEEK MEDICAL CARE IF:  °· You are unable to put weight on your leg. °· Your hip is red or swollen or very tender to touch. °· Your pain or swelling continues or worsens after 1 week. °· You have increasing difficulty walking. °· You have a fever. °SEEK IMMEDIATE MEDICAL CARE IF:  °· You have fallen. °· You have a sudden increase in pain and swelling in your hip. °MAKE SURE YOU:  °· Understand these instructions. °· Will watch your condition. °· Will get help right away if you are not doing well or get worse. °Document Released: 07/09/2009 Document Revised: 06/05/2013 Document Reviewed: 09/15/2012 °[employer]® Patient Information ©2015 [employer]. This information is not intended to replace advice given to you by your  health care provider. Make sure you discuss any questions you have with your health care provider. ° °

## 2013-12-13 NOTE — ED Provider Notes (Signed)
CSN: [id number]     Arrival date & time 12/13/13  1754 History   First MD Initiated Contact with Patient 12/13/13 1815     Chief Complaint  Patient presents with  . Hip Pain   (Consider location/radiation/quality/duration/timing/severity/associated sxs/prior Treatment) HPI         48 year old female with a history of "severe scoliosis" presents for evaluation of right lower back and hip pain. For about one week she has had pain that is very sharp, electric, shooting up and down her back. This is localized to the right side. It is worse with any movement. It seems to get better when she elevates her legs. She was seen in the emergency department for this. She was given 60 mg of IM Toradol which helped temporarily but the next morning her symptoms have returned. She also gets a tingling sensation in the bottoms of both feet. She denies fever, chills, NVD, extremity numbness or weakness, loss of bowel or bladder control, genital numbness or tingling. She has no history of IV drug use and no history of cancers.  Denies any injury  Past Medical History  Diagnosis Date  . Scoliosis    History reviewed. No pertinent past surgical history. Family History  Problem Relation Age of Onset  . Hyperlipidemia Father   . Hypertension Other   . Diabetes Other    History  Substance Use Topics  . Smoking status: Never Smoker   . Smokeless tobacco: Not on file  . Alcohol Use: Yes     Comment: occ   OB History    No data available     Review of Systems  Musculoskeletal: Positive for back pain and arthralgias.       Right hip pain  All other systems reviewed and are negative.   Allergies  Review of patient's allergies indicates no known allergies.  Home Medications   Prior to Admission medications   Medication Sig Start Date End Date Taking? Authorizing Provider  cyclobenzaprine (FLEXERIL) 10 MG tablet Take 1 tablet (10 mg total) by mouth 2 (two) times daily as needed for muscle spasms.  04/05/13  Yes Marissa Sciacca, PA-C  meloxicam (MOBIC) 7.5 MG tablet Take 1 tablet (7.5 mg total) by mouth daily. 04/05/13  Yes Marissa Sciacca, PA-C  etodolac (LODINE) 500 MG tablet Take 1 tablet (500 mg total) by mouth 2 (two) times daily. 12/13/13   Adrian BlackwaterZachary H Zacharee Gaddie, PA-C  Menthol, Topical Analgesic, (ICY HOT) 5 % PADS Apply 1 each topically daily.    Historical Provider, MD  methocarbamol (ROBAXIN) 500 MG tablet Take 1 tablet (500 mg total) by mouth 2 (two) times daily as needed for muscle spasms. 12/11/13   Lawana ChambersWilliam Duncan Dansie, PA  naproxen (NAPROSYN) 500 MG tablet Take 1 tablet (500 mg total) by mouth 2 (two) times daily with a meal. 12/11/13   Lawana ChambersWilliam Duncan Dansie, PA  oxyCODONE-acetaminophen (PERCOCET/ROXICET) 5-325 MG per tablet Take 1 tablet by mouth every 4 (four) hours as needed for moderate pain or severe pain. 12/13/13   Adrian BlackwaterZachary H Corra Kaine, PA-C   BP 140/88 mmHg  Pulse 74  Resp 12  SpO2 100%  LMP 12/06/2013 (Exact Date) Physical Exam  Constitutional: She is oriented to person, place, and time. Vital signs are normal. She appears well-developed and well-nourished. No distress.  HENT:  Head: Normocephalic and atraumatic.  Cardiovascular: Normal rate, regular rhythm and normal heart sounds.   Pulmonary/Chest: Effort normal and breath sounds normal. No respiratory distress.  Musculoskeletal:  Right hip: She exhibits decreased range of motion (decreased flexion or extension) and decreased strength (Most likely secondary to pain). She exhibits no tenderness, no bony tenderness, no swelling and no deformity.       Lumbar back: She exhibits deformity (Scoliosis). She exhibits normal range of motion and no tenderness.  Neurological: She is alert and oriented to person, place, and time. She has normal strength and normal reflexes. She exhibits normal muscle tone. Gait (antalgic, favoring the right) abnormal. Coordination normal.  Skin: Skin is warm and dry. No rash noted. She is not  diaphoretic.  Psychiatric: She has a normal mood and affect. Judgment normal.  Nursing note and vitals reviewed.   ED Course  Procedures (including critical care time) Labs Review Labs Reviewed  POCT URINALYSIS DIP (DEVICE)    Imaging Review Dg Hip Complete Right  12/13/2013   CLINICAL DATA:  Right buttock pain for 1 week, initial encounter, no trauma  EXAM: RIGHT HIP - COMPLETE 2+ VIEW  COMPARISON:  None.  FINDINGS: The pelvic ring is intact. No fracture or dislocation is seen. Bilateral Essure coils are noted. No soft tissue changes are seen. Scoliosis concave to the right is noted in the lumbar spine.  IMPRESSION: No acute abnormality noted.   Electronically Signed   By: Alcide CleverMark  Lukens M.D.   On: 12/13/2013 19:25     MDM   1. Hip pain, right   2. Back pain with right-sided radiculopathy    X-rays negative.  Strength and sensation are intact and equal bilaterally. DTRs 5 out of 5 bilaterally. Treat symptomatically, follow-up with orthopedics   Meds ordered this encounter  Medications  . ketorolac (TORADOL) injection 60 mg    Sig:   . oxyCODONE-acetaminophen (PERCOCET/ROXICET) 5-325 MG per tablet    Sig: Take 1 tablet by mouth every 4 (four) hours as needed for moderate pain or severe pain.    Dispense:  15 tablet    Refill:  0    Order Specific Question:  Supervising Provider    Answer:  Charm RingsHONIG, ERIN J [id number]]  . etodolac (LODINE) 500 MG tablet    Sig: Take 1 tablet (500 mg total) by mouth 2 (two) times daily.    Dispense:  30 tablet    Refill:  1    Order Specific Question:  Supervising Provider    Answer:  Charm RingsHONIG, ERIN J [[id number]]       Graylon GoodZachary H Everett Ehrler, PA-C 12/13/13 1940

## 2013-12-28 ENCOUNTER — Encounter (HOSPITAL_COMMUNITY): Payer: Self-pay | Admitting: Emergency Medicine

## 2013-12-28 ENCOUNTER — Emergency Department (HOSPITAL_COMMUNITY)
Admission: EM | Admit: 2013-12-28 | Discharge: 2013-12-28 | Disposition: A | Discharge status: Home / Self Care | Payer: Medicaid Other | Attending: Emergency Medicine | Admitting: Emergency Medicine

## 2013-12-28 DIAGNOSIS — M25559 Pain in unspecified hip: Secondary | ICD-10-CM | POA: Insufficient documentation

## 2013-12-28 DIAGNOSIS — M5441 Lumbago with sciatica, right side: Secondary | ICD-10-CM | POA: Insufficient documentation

## 2013-12-28 DIAGNOSIS — Z79899 Other long term (current) drug therapy: Secondary | ICD-10-CM | POA: Insufficient documentation

## 2013-12-28 MED ORDER — OXYCODONE-ACETAMINOPHEN 5-325 MG PO TABS: 1.0000 | ORAL_TABLET | ORAL | Status: AC | PRN

## 2013-12-28 MED ORDER — NAPROXEN 500 MG PO TABS: 500.0000 mg | ORAL_TABLET | Freq: Two times a day (BID) | ORAL | Status: AC

## 2013-12-28 MED ORDER — DIAZEPAM 5 MG PO TABS
5.0000 mg | ORAL_TABLET | Freq: Once | ORAL | Status: AC
  Administered 2013-12-28: 5 mg via ORAL
  Filled 2013-12-28: qty 1

## 2013-12-28 MED ORDER — CYCLOBENZAPRINE HCL 10 MG PO TABS: 10.0000 mg | ORAL_TABLET | Freq: Two times a day (BID) | ORAL | Status: AC | PRN

## 2013-12-28 MED ORDER — HYDROMORPHONE HCL 1 MG/ML IJ SOLN
1.0000 mg | Freq: Once | INTRAMUSCULAR | Status: AC
  Administered 2013-12-28: 1 mg via INTRAVENOUS
  Filled 2013-12-28: qty 1

## 2013-12-28 MED ORDER — KETOROLAC TROMETHAMINE 30 MG/ML IJ SOLN
30.0000 mg | Freq: Once | INTRAMUSCULAR | Status: AC
  Administered 2013-12-28: 30 mg via INTRAVENOUS
  Filled 2013-12-28: qty 1

## 2013-12-28 NOTE — ED Notes (Signed)
Pt c/o back pain, right hip pain and right leg pain.  Pt states that she was seen here couple weeks ago and at urgent care where she had xray and MRI done. Pt states that on Tuesday she had cortisone injection in right hip area and then yesterday pt unable to bear weight on right leg and very painful with sitting laying on the right side.  Pt hasnt follow-up with orthopedic who did injection.

## 2013-12-28 NOTE — ED Provider Notes (Signed)
CSN: [id number]     Arrival date & time 12/28/13  [id number] History   First MD Initiated Contact with Patient 12/28/13 [phone]     Chief Complaint  Patient presents with  . Back Pain  . Hip Pain     HPI Patient reports 3 weeks of low back pain with some radiation down towards her left leg.  She was seen in the emergency department and urgent care and this was thought to be more musculoskeletal.  She was seen by her orthopedic surgeon who reportedly didn't MRI and found an "inflamed nerve".  She had been getting steroids and anti-inflammatories and improving however after her MRI she received a cortisone injection in the orthopedic office using pain discomfort since then.  She does not take the oxycodone less she is in severe pain.  She tried one this morning and it did not help her symptoms.  No fevers or chills.  No history of IV drug abuse.  Denies nausea vomiting.  No abdominal pain.  No urinary complaints.   Past Medical History  Diagnosis Date  . Scoliosis    History reviewed. No pertinent past surgical history. Family History  Problem Relation Age of Onset  . Hyperlipidemia Father   . Hypertension Other   . Diabetes Other    History  Substance Use Topics  . Smoking status: Never Smoker   . Smokeless tobacco: Not on file  . Alcohol Use: Yes     Comment: occ   OB History    No data available     Review of Systems  All other systems reviewed and are negative.     Allergies  Review of patient's allergies indicates no known allergies.  Home Medications   Prior to Admission medications   Medication Sig Start Date End Date Taking? Authorizing Provider  cyclobenzaprine (FLEXERIL) 10 MG tablet Take 1 tablet (10 mg total) by mouth 2 (two) times daily as needed for muscle spasms. 04/05/13   Marissa Sciacca, PA-C  etodolac (LODINE) 500 MG tablet Take 1 tablet (500 mg total) by mouth 2 (two) times daily. 12/13/13   Graylon GoodZachary H Baker, PA-C  meloxicam (MOBIC) 7.5 MG tablet Take 1 tablet  (7.5 mg total) by mouth daily. 04/05/13   Marissa Sciacca, PA-C  Menthol, Topical Analgesic, (ICY HOT) 5 % PADS Apply 1 each topically daily.    Historical Provider, MD  methocarbamol (ROBAXIN) 500 MG tablet Take 1 tablet (500 mg total) by mouth 2 (two) times daily as needed for muscle spasms. 12/11/13   Einar GipWilliam Duncan Dansie, PA-C  naproxen (NAPROSYN) 500 MG tablet Take 1 tablet (500 mg total) by mouth 2 (two) times daily with a meal. 12/11/13   Einar GipWilliam Duncan Dansie, PA-C  oxyCODONE-acetaminophen (PERCOCET/ROXICET) 5-325 MG per tablet Take 1 tablet by mouth every 4 (four) hours as needed for moderate pain or severe pain. 12/13/13   Adrian BlackwaterZachary H Baker, PA-C   BP 142/91 mmHg  Pulse 69  Temp(Src) 98.4 F (36.9 C) (Oral)  Resp 18  SpO2 100%  LMP 12/06/2013 (Exact Date) Physical Exam  Constitutional: She is oriented to person, place, and time. She appears well-developed and well-nourished. No distress.  HENT:  Head: Normocephalic and atraumatic.  Eyes: EOM are normal.  Neck: Normal range of motion.  Cardiovascular: Normal rate, regular rhythm and normal heart sounds.   Pulmonary/Chest: Effort normal and breath sounds normal.  Abdominal: Soft. She exhibits no distension. There is no tenderness.  Musculoskeletal: Normal range of motion.  5 out of  5 strength in bilateral lower extremity major muscle groups.  Mild paralumbar spasm and tenderness without focal lumbar tenderness or step-off.  Normal pulses in bilateral feet.  Neurological: She is alert and oriented to person, place, and time.  Skin: Skin is warm and dry.  Psychiatric: She has a normal mood and affect. Judgment normal.  Nursing note and vitals reviewed.   ED Course  Procedures (including critical care time) Labs Review Labs Reviewed - No data to display  Imaging Review No results found.   EKG Interpretation None      MDM   Final diagnoses:  Right-sided low back pain with right-sided sciatica    Suspect lumbar  spasm/sciatica.  Plan will be for symptomatic treatment emergency department and we will try to optimize her outpatient medication regimen to help alleviate her symptoms until she can follow-up with her orthopedic surgeon.  11:18 AM Patient feeling much better at this time.  Discharge home in good condition.  Follow-up with her orthopedic surgeon.  Patient understands to return to the ER for new or worsening symptoms   Lyanne CoKevin M Antuan Limes, MD 12/28/13 1118

## 2013-12-28 NOTE — Discharge Instructions (Signed)

## 2014-05-28 ENCOUNTER — Other Ambulatory Visit: Payer: Self-pay

## 2014-05-28 DIAGNOSIS — Z1231 Encounter for screening mammogram for malignant neoplasm of breast: Secondary | ICD-10-CM

## 2014-06-11 ENCOUNTER — Other Ambulatory Visit: Payer: Self-pay | Admitting: Obstetrics

## 2014-06-11 ENCOUNTER — Other Ambulatory Visit: Payer: Self-pay

## 2014-06-11 DIAGNOSIS — N644 Mastodynia: Secondary | ICD-10-CM

## 2014-06-11 LAB — CYTOLOGY - PAP: PAP SMEAR: NEGATIVE

## 2014-06-12 ENCOUNTER — Other Ambulatory Visit: Payer: Self-pay | Admitting: Obstetrics

## 2014-06-12 DIAGNOSIS — N644 Mastodynia: Secondary | ICD-10-CM

## 2014-06-14 ENCOUNTER — Ambulatory Visit
Admission: RE | Admit: 2014-06-14 | Discharge: 2014-06-14 | Disposition: A | Discharge status: Home / Self Care | Payer: Medicaid Other | Source: Ambulatory Visit | Attending: Obstetrics | Admitting: Obstetrics

## 2014-06-14 ENCOUNTER — Other Ambulatory Visit: Payer: Medicaid Other

## 2014-06-14 DIAGNOSIS — N644 Mastodynia: Secondary | ICD-10-CM

## 2015-03-07 ENCOUNTER — Ambulatory Visit (INDEPENDENT_AMBULATORY_CARE_PROVIDER_SITE_OTHER): Payer: Medicaid Other | Admitting: Obstetrics & Gynecology

## 2015-03-07 ENCOUNTER — Encounter: Payer: Self-pay | Admitting: Obstetrics & Gynecology

## 2015-03-07 VITALS — BP 149/86 | HR 87 | Ht 61.0 in | Wt 130.6 lb

## 2015-03-07 DIAGNOSIS — N949 Unspecified condition associated with female genital organs and menstrual cycle: Secondary | ICD-10-CM | POA: Diagnosis present

## 2015-03-07 DIAGNOSIS — G8929 Other chronic pain: Secondary | ICD-10-CM | POA: Diagnosis not present

## 2015-03-07 DIAGNOSIS — R102 Pelvic and perineal pain: Principal | ICD-10-CM

## 2015-03-07 LAB — POCT URINALYSIS DIP (DEVICE)
BILIRUBIN URINE: NEGATIVE
Glucose, UA: NEGATIVE mg/dL
HGB URINE DIPSTICK: NEGATIVE
Ketones, ur: NEGATIVE mg/dL
Leukocytes, UA: NEGATIVE
NITRITE: NEGATIVE
PH: 6.5 (ref 5.0–8.0)
Protein, ur: NEGATIVE mg/dL
Specific Gravity, Urine: >=1.03 (ref 1.005–1.030)
Urobilinogen, UA: 0.2 mg/dL (ref 0.0–1.0)

## 2015-03-07 MED ORDER — IBUPROFEN 800 MG PO TABS: 800.0000 mg | ORAL_TABLET | Freq: Three times a day (TID) | ORAL | Status: AC | PRN

## 2015-03-07 NOTE — Patient Instructions (Signed)
Laparoscopically Assisted Vaginal Hysterectomy A laparoscopically assisted vaginal hysterectomy (LAVH) is a surgical procedure to remove the uterus and cervix, and sometimes the ovaries and fallopian tubes. During an LAVH, some of the surgical removal is done through the vagina, and the rest is done through a few small surgical cuts (incisions) in the abdomen.  This procedure is usually considered in women when a vaginal hysterectomy is not an option. Your health care provider will discuss the risks and benefits of the different surgical techniques at your appointment. Generally, recovery time is faster and there are fewer complications after laparoscopic procedures than after open incisional procedures. LET YOUR HEALTH CARE PROVIDER KNOW ABOUT:   Any allergies you have.  All medicines you are taking, including vitamins, herbs, eye drops, creams, and over-the-counter medicines.  Previous problems you or members of your family have had with the use of anesthetics.  Any blood disorders you have.  Previous surgeries you have had.  Medical conditions you have. RISKS AND COMPLICATIONS Generally, this is a safe procedure. However, as with any procedure, complications can occur. Possible complications include:  Allergies to medicines.  Difficulty breathing.  Bleeding.  Infection.  Damage to other structures near your uterus and cervix. BEFORE THE PROCEDURE  Ask your health care provider about changing or stopping your regular medicines.  Take certain medicines, such as a colon-emptying preparation, as directed.  Do not eat or drink anything for at least 8 hours before your surgery.  Stop smoking if you smoke. Stopping will improve your health after surgery.  Arrange for a ride home after surgery and for help at home during recovery. PROCEDURE   An IV tube will be put into one of your veins in order to give you fluids and medicines.  You will receive medicines to relax you and  medicines that make you sleep (general anesthetic).  You may have a flexible tube (catheter) put into your bladder to drain urine.  You may have a tube put through your nose or mouth that goes into your stomach (nasogastric tube). The nasogastric tube removes digestive fluids and prevents you from feeling nauseated and from vomiting.  Tight-fitting (compression) stockings will be placed on your legs to promote circulation.  Three to four small incisions will be made in your abdomen. An incision also will be made in your vagina. Probes and tools will be inserted into the small incisions. The uterus and cervix are removed (and possibly your ovaries and fallopian tubes) through your vagina as well as through the small incisions that were made in the abdomen.  Your vagina is then sewn back to normal. AFTER THE PROCEDURE  You may have a liquid diet temporarily. You will most likely return to, and tolerate, your usual diet the day after surgery.  You will be passing urine through a catheter. It will be removed the day after surgery.  Your temperature, breathing rate, heart rate, blood pressure, and oxygen level will be monitored regularly.  You will still wear compression stockings on your legs until you are able to move around.  You will use a special device or do breathing exercises to keep your lungs clear.  You will be encouraged to walk as soon as possible.   This information is not intended to replace advice given to you by your health care provider. Make sure you discuss any questions you have with your health care provider.   Document Released: 01/08/2011 Document Revised: 02/09/2014 Document Reviewed: 08/04/2012 Elsevier Interactive Patient Education 2016 Elsevier   Inc.  

## 2015-03-07 NOTE — Progress Notes (Signed)
   CLINIC ENCOUNTER NOTE  History:  50 y.o. [id number] (SVD x 3) here today for evaluation of worsening chronic pelvic pain s/p Essure placement in 2010.  Pain used to be tolerable but has gotten worse over time. Feels stabbing, pain in lower pelvis and abdomen, shooting into vagina.  Has gone to ED, and had to get IV pain medication.  Occasionally, takes Ibuprofen 800 mg for pain.  Has some missed periods but attributes this to perimenopause. Has been evaluated by Dr. Gaynell Face, who sent her here.  She denies any abnormal vaginal discharge or other concerns.   Past Medical History  Diagnosis Date  . Scoliosis     Past Surgical History  Procedure Laterality Date  . Essure tubal ligation  06/2008    The following portions of the patient's history were reviewed and updated as appropriate: allergies, current medications, past family history, past medical history, past social history, past surgical history and problem list.   Health Maintenance:  Normal pap on 05/2014.  Normal mammogram on 06/14/2014.   Review of Systems:  Pertinent items noted in HPI and remainder of comprehensive ROS otherwise negative.  Objective:  Physical Exam BP 149/86 mmHg  Pulse 87  Ht  (1.549 m)  Wt 130 lb 9.6 oz (59.24 kg)  BMI 24.69 kg/m2  LMP 02/22/2015 (Exact Date) CONSTITUTIONAL: Well-developed, well-nourished female in no acute distress.  HENT:  Normocephalic, atraumatic. External right and left ear normal. Oropharynx is clear and moist EYES: Conjunctivae and EOM are normal. Pupils are equal, round, and reactive to light. No scleral icterus.  NECK: Normal range of motion, supple, no masses SKIN: Skin is warm and dry. No rash noted. Not diaphoretic. No erythema. No pallor. NEUROLGIC: Alert and oriented to person, place, and time. Normal reflexes, muscle tone coordination. No cranial nerve deficit noted. PSYCHIATRIC: Normal mood and affect. Normal behavior. Normal judgment and thought  content. CARDIOVASCULAR: Normal heart rate noted RESPIRATORY: Effort and breath sounds normal, no problems with respiration noted ABDOMEN: Soft, no distention noted.   PELVIC: Normal appearing external genitalia; normal appearing vaginal mucosa and cervix.  No abnormal discharge noted.  Normal uterine size, no other palpable masses, + uterine tenderness. MUSCULOSKELETAL: Normal range of motion. No edema noted.   Assessment & Plan:  Chronic female pelvic pain s/p Essure placement in 2010 Pelvic ultrasound ordered to examine position of Essure implants Recommended NSAIDs for pain Only way to cure Essure-related pain is to perform bilateral cornual removal of the uterus vs hysterectomy.  Patient is leaning towards hysterectomy. Will follow up ultrasound and decide on best modality, likely LAVH, BS to ensure the tubes are removed.  Will return for preoperative visit  Routine preventative health maintenance measures emphasized. Please refer to After Visit Summary for other counseling recommendations.     Total face-to-face time with patient: 20 minutes. Over 50% of encounter was spent on counseling and coordination of care.   Jaynie Collins, MD, FACOG Attending Obstetrician & Gynecologist, [facility]

## 2015-03-18 ENCOUNTER — Ambulatory Visit (HOSPITAL_COMMUNITY)
Admission: RE | Admit: 2015-03-18 | Discharge: 2015-03-18 | Disposition: A | Discharge status: Home / Self Care | Payer: Medicaid Other | Source: Ambulatory Visit | Attending: Obstetrics & Gynecology | Admitting: Obstetrics & Gynecology

## 2015-03-18 DIAGNOSIS — G8929 Other chronic pain: Secondary | ICD-10-CM | POA: Diagnosis not present

## 2015-03-18 DIAGNOSIS — R102 Pelvic and perineal pain: Secondary | ICD-10-CM

## 2015-03-18 DIAGNOSIS — N949 Unspecified condition associated with female genital organs and menstrual cycle: Secondary | ICD-10-CM | POA: Diagnosis present

## 2015-03-18 DIAGNOSIS — Z9689 Presence of other specified functional implants: Secondary | ICD-10-CM | POA: Diagnosis not present

## 2015-03-18 NOTE — Addendum Note (Signed)
Addended by: Jaynie Collins A on: 03/18/2015 04:35 PM   Modules accepted: Orders

## 2015-03-20 ENCOUNTER — Telehealth: Payer: Self-pay | Admitting: *Deleted

## 2015-03-20 ENCOUNTER — Encounter (HOSPITAL_COMMUNITY): Payer: Self-pay | Admitting: *Deleted

## 2015-03-20 NOTE — Telephone Encounter (Signed)
Called patient and informed her of u/s result showing essure implants in place. Discussed scheduled preop visit and lavh w/ bs. Patient voiced understanding and had no further questions.

## 2015-03-20 NOTE — Telephone Encounter (Signed)
-----   Message from Tereso Newcomer, MD sent at 03/18/2015  4:31 PM EST ----- Essure implants in place.  Patient desires hysterectomy, will send order for LAVH, BS to be scheduled as per our previous conversation during her last visit.  She will be scheduled for preoperative visit a few weeks before the hysterectomy. Please call to inform patient of results and recommendations.

## 2015-04-15 ENCOUNTER — Ambulatory Visit (INDEPENDENT_AMBULATORY_CARE_PROVIDER_SITE_OTHER): Payer: Medicaid Other | Admitting: Obstetrics & Gynecology

## 2015-04-15 ENCOUNTER — Encounter: Payer: Self-pay | Admitting: Obstetrics & Gynecology

## 2015-04-15 VITALS — BP 133/85 | HR 78 | Temp 99.1°F | Wt 129.3 lb

## 2015-04-15 DIAGNOSIS — R102 Pelvic and perineal pain: Principal | ICD-10-CM

## 2015-04-15 DIAGNOSIS — N949 Unspecified condition associated with female genital organs and menstrual cycle: Secondary | ICD-10-CM

## 2015-04-15 DIAGNOSIS — G8929 Other chronic pain: Secondary | ICD-10-CM | POA: Diagnosis not present

## 2015-04-15 NOTE — Patient Instructions (Signed)
Return to clinic for any scheduled appointments or for any gynecologic concerns as needed.   Laparoscopically Assisted Vaginal Hysterectomy A laparoscopically assisted vaginal hysterectomy (LAVH) is a surgical procedure to remove the uterus and cervix, and sometimes the ovaries and fallopian tubes. During an LAVH, some of the surgical removal is done through the vagina, and the rest is done through a few small surgical cuts (incisions) in the abdomen.  This procedure is usually considered in women when a vaginal hysterectomy is not an option. Your health care provider will discuss the risks and benefits of the different surgical techniques at your appointment. Generally, recovery time is faster and there are fewer complications after laparoscopic procedures than after open incisional procedures. LET YOUR HEALTH CARE PROVIDER KNOW ABOUT:   Any allergies you have.  All medicines you are taking, including vitamins, herbs, eye drops, creams, and over-the-counter medicines.  Previous problems you or members of your family have had with the use of anesthetics.  Any blood disorders you have.  Previous surgeries you have had.  Medical conditions you have. RISKS AND COMPLICATIONS Generally, this is a safe procedure. However, as with any procedure, complications can occur. Possible complications include:  Allergies to medicines.  Difficulty breathing.  Bleeding.  Infection.  Damage to other structures near your uterus and cervix. BEFORE THE PROCEDURE  Ask your health care provider about changing or stopping your regular medicines.  Take certain medicines, such as a colon-emptying preparation, as directed.  Do not eat or drink anything for at least 8 hours before your surgery.  Stop smoking if you smoke. Stopping will improve your health after surgery.  Arrange for a ride home after surgery and for help at home during recovery. PROCEDURE   An IV tube will be put into one of your  veins in order to give you fluids and medicines.  You will receive medicines to relax you and medicines that make you sleep (general anesthetic).  You may have a flexible tube (catheter) put into your bladder to drain urine.  You may have a tube put through your nose or mouth that goes into your stomach (nasogastric tube). The nasogastric tube removes digestive fluids and prevents you from feeling nauseated and from vomiting.  Tight-fitting (compression) stockings will be placed on your legs to promote circulation.  Three to four small incisions will be made in your abdomen. An incision also will be made in your vagina. Probes and tools will be inserted into the small incisions. The uterus and cervix are removed (and possibly your ovaries and fallopian tubes) through your vagina as well as through the small incisions that were made in the abdomen.  Your vagina is then sewn back to normal. AFTER THE PROCEDURE  You may have a liquid diet temporarily. You will most likely return to, and tolerate, your usual diet the day after surgery.  You will be passing urine through a catheter. It will be removed the day after surgery.  Your temperature, breathing rate, heart rate, blood pressure, and oxygen level will be monitored regularly.  You will still wear compression stockings on your legs until you are able to move around.  You will use a special device or do breathing exercises to keep your lungs clear.  You will be encouraged to walk as soon as possible.   This information is not intended to replace advice given to you by your health care provider. Make sure you discuss any questions you have with your health care provider.     Document Released: 01/08/2011 Document Revised: 02/09/2014 Document Reviewed: 08/04/2012 Elsevier Interactive Patient Education [employer]2016 Elsevier Inc.

## 2015-04-15 NOTE — Progress Notes (Signed)
CLINIC ENCOUNTER NOTE  History:  50 y.o. [id number] here today for follow up after ultrasound done for evaluation of worsening chronic pelvic pain s/p Essure placement in 2010.Marland Kitchen. Still has periodic pain attributed to the Essure. She denies any abnormal vaginal discharge, bleeding or other concerns.   Past Medical History  Diagnosis Date  . Scoliosis     Past Surgical History  Procedure Laterality Date  . Essure tubal ligation  06/2008   The following portions of the patient's history were reviewed and updated as appropriate: allergies, current medications, past family history, past medical history, past social history, past surgical history and problem list.   Health Maintenance:  Normal pap on 05/2014. Normal mammogram on 06/14/2014.  Review of Systems:  Pertinent items noted in HPI and remainder of comprehensive ROS otherwise negative.  Objective:  Physical Exam BP 133/85 mmHg  Pulse 78  Temp(Src) 99.1 F (37.3 C) (Oral)  Wt 129 lb 4.8 oz (58.65 kg)  LMP 03/20/2015 (Exact Date) CONSTITUTIONAL: Well-developed, well-nourished female in no acute distress.  HENT:  Normocephalic, atraumatic. External right and left ear normal. Oropharynx is clear and moist EYES: Conjunctivae and EOM are normal. Pupils are equal, round, and reactive to light. No scleral icterus.  NECK: Normal range of motion, supple, no masses SKIN: Skin is warm and dry. No rash noted. Not diaphoretic. No erythema. No pallor. NEUROLOGIC: Alert and oriented to person, place, and time. Normal reflexes, muscle tone coordination. No cranial nerve deficit noted. PSYCHIATRIC: Normal mood and affect. Normal behavior. Normal judgment and thought content. CARDIOVASCULAR: Normal heart rate noted RESPIRATORY: Effort and breath sounds normal, no problems with respiration noted ABDOMEN: Soft, no distention noted.   PELVIC: Deferred MUSCULOSKELETAL: Normal range of motion. No edema noted.  Labs and Imaging [place] Transvaginal  Non-ob  03/18/2015  CLINICAL DATA:  Chronic pelvic pain, left greater than right EXAM: TRANSABDOMINAL AND TRANSVAGINAL ULTRASOUND OF PELVIS TECHNIQUE: Both transabdominal and transvaginal ultrasound examinations of the pelvis were performed. Transabdominal technique was performed for global imaging of the pelvis including uterus, ovaries, adnexal regions, and pelvic cul-de-sac. It was necessary to proceed with endovaginal exam following the transabdominal exam to visualize the endometrium. COMPARISON:  None FINDINGS: Uterus Measurements: 8.4 x 5.0 x 5.6 cm. No fibroids or other mass visualized. Endometrium Thickness: 7 mm.  No focal abnormality visualized. Right ovary Measurements: 2.4 x 1.5 x 2.1 cm. Normal appearance/no adnexal mass. Left ovary Measurements: 2.3 x 1.3 x 2.0 cm. Normal appearance/no adnexal mass. Other findings No abnormal free fluid. Right Essure implant at the cornua. Left Essure implant in the proximal fallopian tube. IMPRESSION: Bilateral Essure implants, as above. Otherwise negative pelvic ultrasound. Electronically Signed   By: Charline BillsSriyesh  Krishnan M.D.   On: 03/18/2015 11:40   [place] Pelvis Complete  03/18/2015  CLINICAL DATA:  Chronic pelvic pain, left greater than right EXAM: TRANSABDOMINAL AND TRANSVAGINAL ULTRASOUND OF PELVIS TECHNIQUE: Both transabdominal and transvaginal ultrasound examinations of the pelvis were performed. Transabdominal technique was performed for global imaging of the pelvis including uterus, ovaries, adnexal regions, and pelvic cul-de-sac. It was necessary to proceed with endovaginal exam following the transabdominal exam to visualize the endometrium. COMPARISON:  None FINDINGS: Uterus Measurements: 8.4 x 5.0 x 5.6 cm. No fibroids or other mass visualized. Endometrium Thickness: 7 mm.  No focal abnormality visualized. Right ovary Measurements: 2.4 x 1.5 x 2.1 cm. Normal appearance/no adnexal mass. Left ovary Measurements: 2.3 x 1.3 x 2.0 cm. Normal appearance/no  adnexal mass. Other findings No abnormal  free fluid. Right Essure implant at the cornua. Left Essure implant in the proximal fallopian tube. IMPRESSION: Bilateral Essure implants, as above. Otherwise negative pelvic ultrasound. Electronically Signed   By: Charline Bills M.D.   On: 03/18/2015 11:40    Assessment & Plan:   Chronic female pelvic pain s/p Essure in 2010 Ultrasound results reviewed with patient She cancelled scheduled LAVH/BS, will have it done in June [id number] instead Will continue pain medications as needed. Routine preventative health maintenance measures emphasized. Please refer to After Visit Summary for other counseling recommendations.   Total face-to-face time with patient: 15 minutes. Over 50% of encounter was spent on counseling and coordination of care.   Jaynie Collins, MD, FACOG Attending Obstetrician & Gynecologist, [facility]

## 2015-05-21 ENCOUNTER — Encounter (HOSPITAL_COMMUNITY): Admission: RE | Payer: Self-pay | Source: Ambulatory Visit

## 2015-05-21 ENCOUNTER — Ambulatory Visit (HOSPITAL_COMMUNITY)
Admission: RE | Admit: 2015-05-21 | Payer: Medicaid Other | Source: Ambulatory Visit | Admitting: Obstetrics & Gynecology

## 2015-05-21 ENCOUNTER — Encounter (HOSPITAL_COMMUNITY): Payer: Self-pay | Admitting: *Deleted

## 2015-05-21 SURGERY — HYSTERECTOMY, VAGINAL, LAPAROSCOPY-ASSISTED, WITH SALPINGECTOMY
Anesthesia: Choice | Site: Vagina | Laterality: Bilateral

## 2015-07-09 ENCOUNTER — Encounter (HOSPITAL_COMMUNITY): Admission: RE | Payer: Self-pay | Source: Ambulatory Visit

## 2015-07-09 ENCOUNTER — Inpatient Hospital Stay (HOSPITAL_COMMUNITY)
Admission: RE | Admit: 2015-07-09 | Payer: Medicaid Other | Source: Ambulatory Visit | Admitting: Obstetrics & Gynecology

## 2015-07-09 SURGERY — HYSTERECTOMY, VAGINAL, LAPAROSCOPY-ASSISTED
Anesthesia: General

## 2015-08-12 ENCOUNTER — Other Ambulatory Visit: Payer: Self-pay | Admitting: Obstetrics

## 2015-08-12 DIAGNOSIS — Z1231 Encounter for screening mammogram for malignant neoplasm of breast: Secondary | ICD-10-CM

## 2015-08-15 ENCOUNTER — Other Ambulatory Visit: Payer: Self-pay | Admitting: Obstetrics

## 2015-08-15 ENCOUNTER — Ambulatory Visit
Admission: RE | Admit: 2015-08-15 | Discharge: 2015-08-15 | Disposition: A | Discharge status: Home / Self Care | Payer: Medicaid Other | Source: Ambulatory Visit | Attending: Obstetrics | Admitting: Obstetrics

## 2015-08-15 DIAGNOSIS — Z1231 Encounter for screening mammogram for malignant neoplasm of breast: Secondary | ICD-10-CM

## 2015-08-21 ENCOUNTER — Ambulatory Visit: Payer: Self-pay | Admitting: Obstetrics & Gynecology

## 2015-09-12 ENCOUNTER — Encounter: Payer: Self-pay | Admitting: Obstetrics & Gynecology

## 2015-09-12 ENCOUNTER — Ambulatory Visit (INDEPENDENT_AMBULATORY_CARE_PROVIDER_SITE_OTHER): Payer: Medicaid Other | Admitting: Obstetrics & Gynecology

## 2015-09-12 DIAGNOSIS — L299 Pruritus, unspecified: Secondary | ICD-10-CM | POA: Insufficient documentation

## 2015-09-12 NOTE — Progress Notes (Signed)
Patient ID: Jillian Casey, female   DOB: 12-21-1965, 50 y.o.   MRN: [id number]  Chief Complaint  Patient presents with  . Gynecologic Exam  deferred having LAVH for pelvic pain  HPI Jillian Casey is a 50 y.o. female.  [id number] Patient's last menstrual period was 08/21/2015. She has daily pelvic pain since placement of Essure but canceled planned hysterectomy in June.  HPI  Past Medical History:  Diagnosis Date  . Scoliosis   . Vaginal Pap smear, abnormal     Past Surgical History:  Procedure Laterality Date  . ESSURE TUBAL LIGATION  06/2008    Family History  Problem Relation Age of Onset  . Hyperlipidemia Father   . Hypertension Other   . Diabetes Other     Social History Social History  Substance Use Topics  . Smoking status: Never Smoker  . Smokeless tobacco: Never Used  . Alcohol use Yes     Comment: occ    No Known Allergies  Current Outpatient Prescriptions  Medication Sig Dispense Refill  . ibuprofen (ADVIL,MOTRIN) 800 MG tablet Take 1 tablet (800 mg total) by mouth every 8 (eight) hours as needed for mild pain, moderate pain or cramping. 30 tablet 5  . acetaminophen-codeine (TYLENOL #3) 300-30 MG tablet Take 1 tablet by mouth every 4 (four) hours as needed for moderate pain.    . cyclobenzaprine (FLEXERIL) 10 MG tablet Take 1 tablet (10 mg total) by mouth 2 (two) times daily as needed for muscle spasms. (Patient not taking: Reported on 03/07/2015) 20 tablet 0  . etodolac (LODINE) 500 MG tablet Take 1 tablet (500 mg total) by mouth 2 (two) times daily. (Patient not taking: Reported on 03/07/2015) 30 tablet 1  . Menthol, Topical Analgesic, (BIOFREEZE EX) Apply 1 application topically 2 (two) times daily as needed. Reported on 04/15/2015    . methocarbamol (ROBAXIN) 500 MG tablet Take 1 tablet (500 mg total) by mouth 2 (two) times daily as needed for muscle spasms. (Patient not taking: Reported on 09/12/2015) 20 tablet 0  . naproxen (NAPROSYN) 500 MG tablet  Take 1 tablet (500 mg total) by mouth 2 (two) times daily with a meal. (Patient not taking: Reported on 03/07/2015) 30 tablet 0  . oxyCODONE-acetaminophen (PERCOCET/ROXICET) 5-325 MG per tablet Take 1 tablet by mouth every 4 (four) hours as needed for moderate pain or severe pain. (Patient not taking: Reported on 03/07/2015) 15 tablet 0   No current facility-administered medications for this visit.     Review of Systems Review of Systems  Constitutional: Negative.   Respiratory: Negative.   Gastrointestinal: Negative.   Genitourinary: Positive for dysuria and pelvic pain. Negative for menstrual problem and vaginal discharge.  Musculoskeletal: Negative.   Skin:       2 months notes generalized itch after shower    Blood pressure 127/89, pulse 87, height  (1.549 m), weight 126 lb (57.2 kg), last menstrual period 08/21/2015.  Physical Exam Physical Exam  Constitutional: She appears well-developed. No distress.  HENT:  Mouth/Throat: Oropharynx is clear and moist.  Neck: Normal range of motion. No thyromegaly present.  Cardiovascular: Normal rate.   Pulmonary/Chest: Effort normal.  Abdominal: Soft. She exhibits no distension and no mass. There is no tenderness.  Genitourinary: Uterus normal. Vaginal discharge found.  Genitourinary Comments: Pap not done normal 06/2014  Neurological: She is alert.  Psychiatric: She has a normal mood and affect. Her behavior is normal.    Data Reviewed Pap result and mammogram  Assessment  Normal well woman exam Pelvic pain s/p Essure Itching after shower    Plan    Consider derm referral, will try antihistamine, mild soap for now Notify us if she wishes to schedule surgery Yearly mammograms Pap repeat in 2-4 years       Anay Walter 09/12/2015, 8:51 AM

## 2015-09-12 NOTE — Patient Instructions (Signed)
Pap Test WHY AM I HAVING THIS TEST? A pap test is sometimes called a pap smear. It is a screening test that is used to check for signs of cancer of the vagina, cervix, and uterus. The test can also identify the presence of infection or precancerous changes. Your health care provider will likely recommend you have this test done on a regular basis. This test may be done:  Every 3 years, starting at age 48.  Every 5 years, in combination with testing for the presence of human papillomavirus (HPV).  More or less often depending on other medical conditions.  WHAT KIND OF SAMPLE IS TAKEN? Using a small cotton swab, plastic spatula, or brush, your health care provider will collect a sample of cells from the surface of your cervix. Your cervix is the opening to your uterus, also called a womb. Secretions from the cervix and vagina may also be collected. HOW DO I PREPARE FOR THE TEST?  Be aware of where you are in your menstrual cycle. You may be asked to reschedule the test if you are menstruating on the day of the test.  You may need to reschedule if you have a known vaginal infection on the day of the test.  You may be asked to avoid douching or taking a bath the day before or the day of the test.  Some medicines can cause abnormal test results, such as digitalis and tetracycline. Talk with your health care provider before your test if you take one of these medicines. WHAT DO THE RESULTS MEAN? Abnormal test results may indicate a number of health conditions. These may include:  Cancer. Although pap test results cannot be used to diagnose cancer of the cervix, vagina, or uterus, they may suggest the possibility of cancer. Further tests would be required to determine if cancer is present.  Sexually transmitted disease.  Fungal infection.  Parasite infection.  Herpes infection.  A condition causing or contributing to infertility. It is your responsibility to obtain your test results. Ask  the lab or department performing the test when and how you will get your results. Contact your health care provider to discuss any questions you have about your results.   This information is not intended to replace advice given to you by your health care provider. Make sure you discuss any questions you have with your health care provider.   Document Released: 04/11/2002 Document Revised: 02/09/2014 Document Reviewed: 06/12/2013 Elsevier Interactive Patient Education 2016 Elsevier Inc.  

## 2015-09-16 ENCOUNTER — Telehealth: Payer: Self-pay

## 2015-09-16 NOTE — Telephone Encounter (Signed)
[facility] Pt will call to set up appt.- per [facility]. Faxed over notes to [facility].

## 2016-11-19 IMAGING — US US TRANSVAGINAL NON-OB
1 series · 15 of 25 positions shown · non-contrast
Comparison: None

CLINICAL DATA: Chronic pelvic pain, left greater than right



[Series 1: us transvaginal non-ob · 92 acquisitions, 15 frames shown]
[im 1/92]
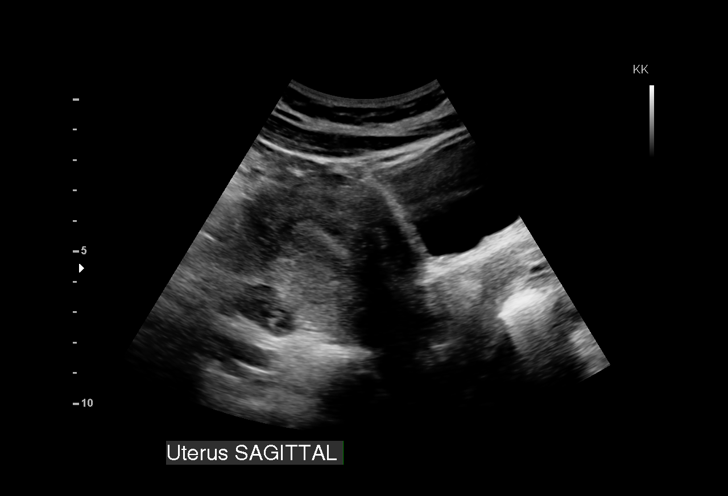
[im 8/92]
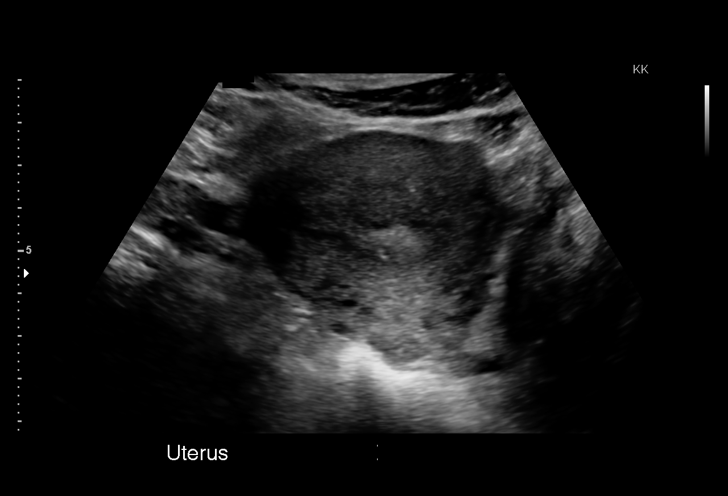
[im 16/92]
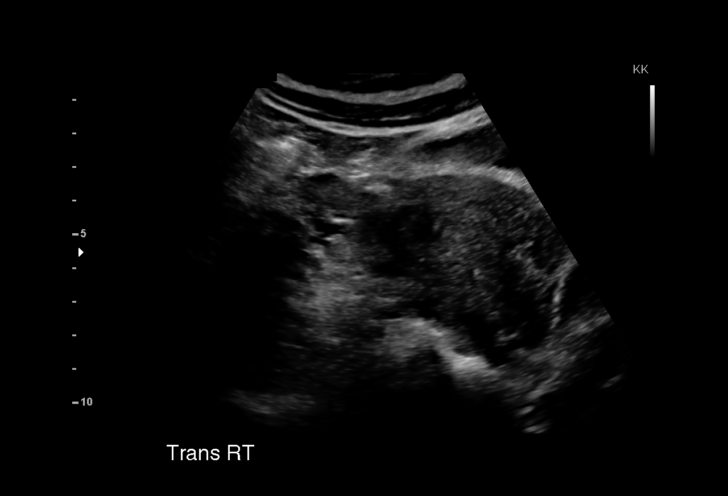
[im 19/92]
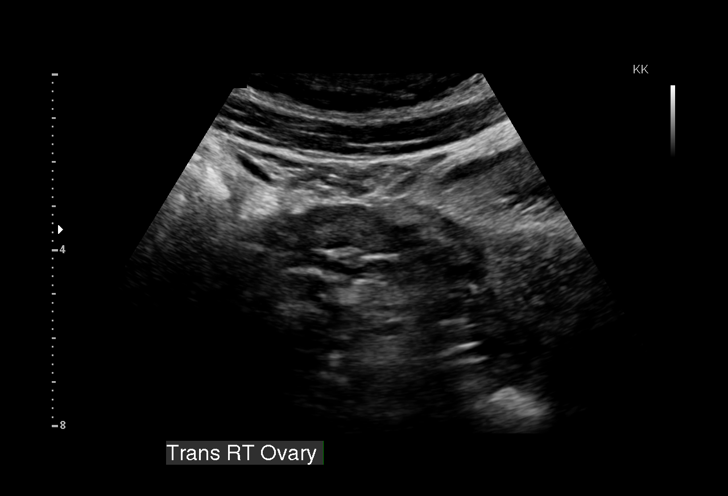
[im 27/92]
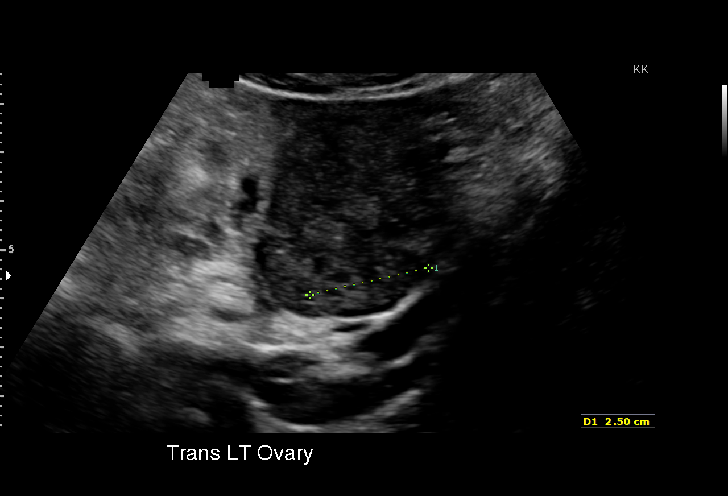
[im 35/92]
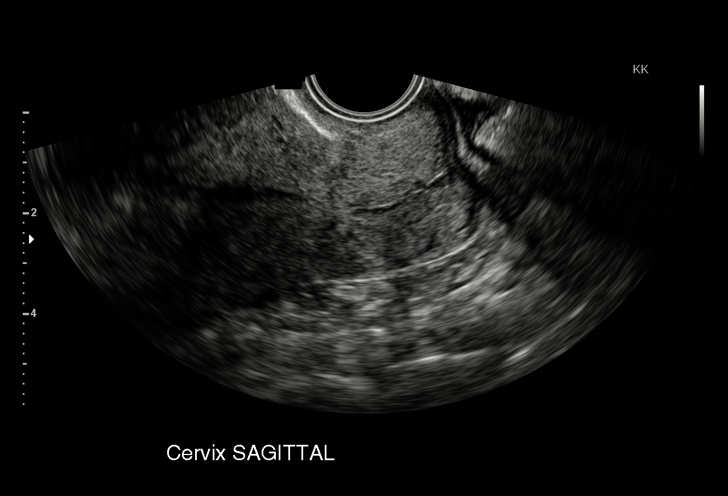
[im 38/92]
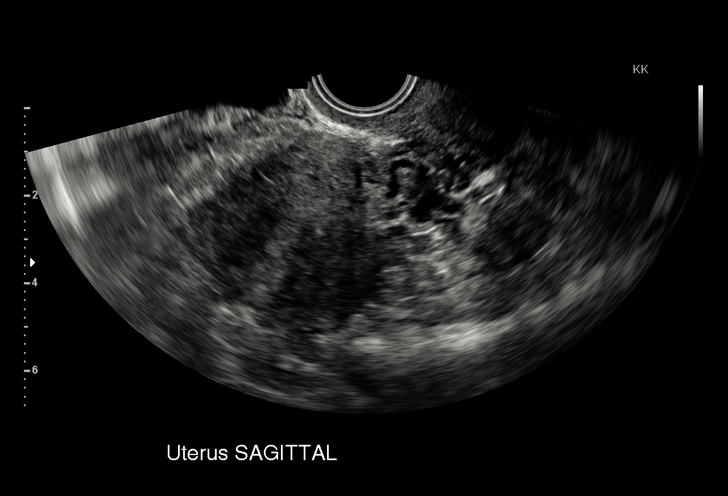
[im 46/92]
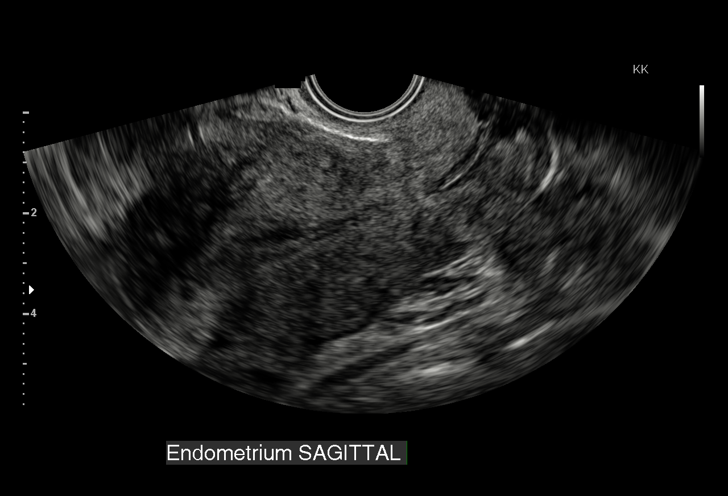
[im 54/92]
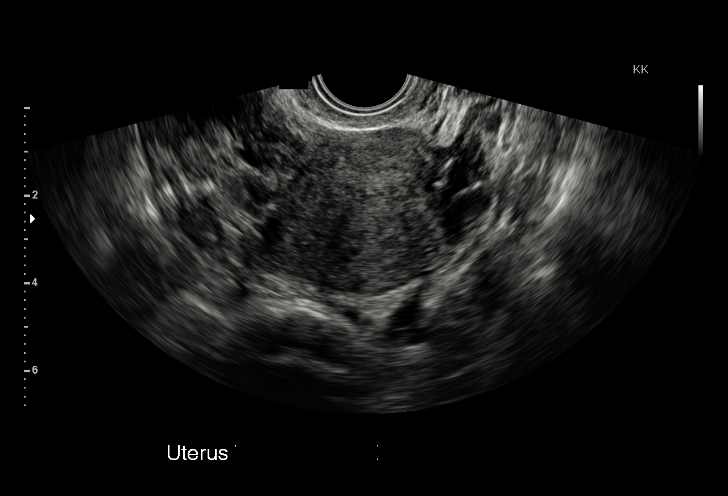
[im 57/92]
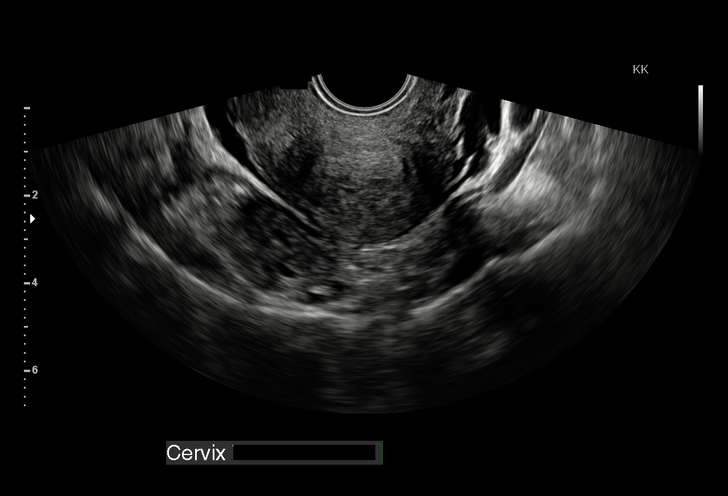
[im 65/92]
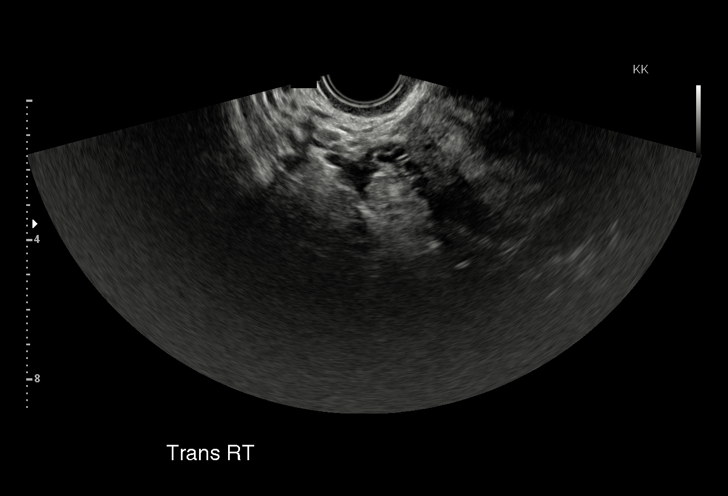
[im 73/92]
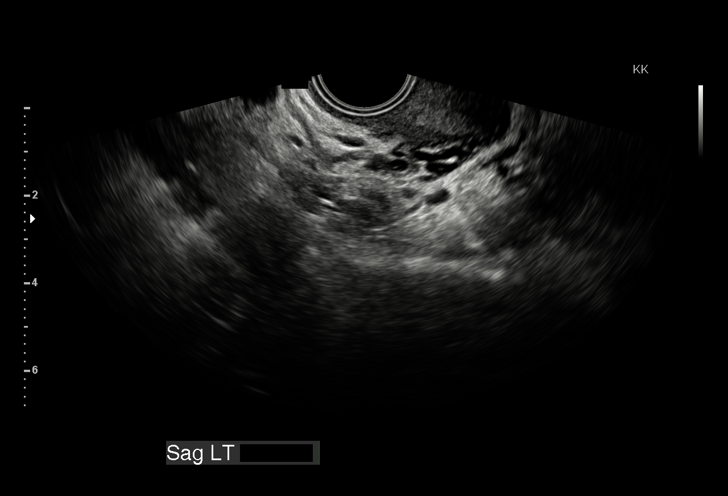
[im 76/92]
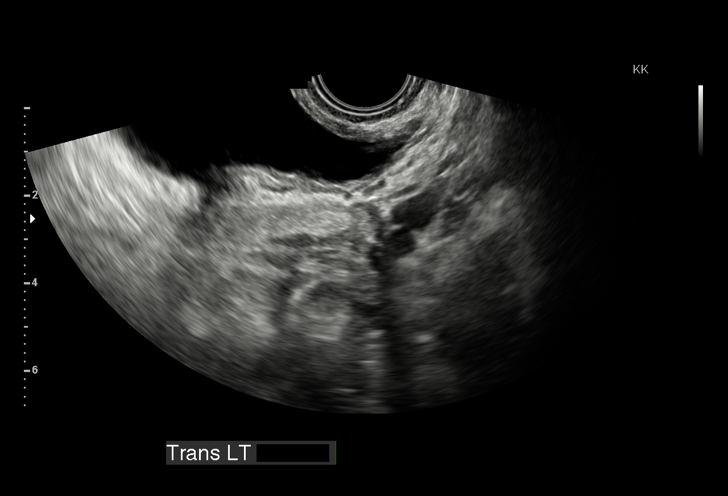
[im 84/92]
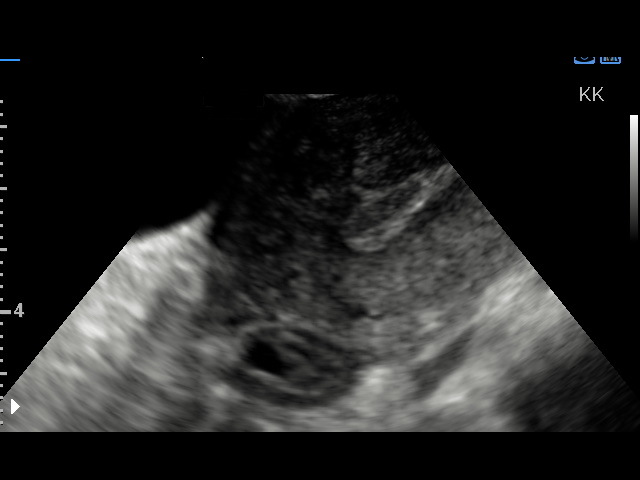
[im 92/92]
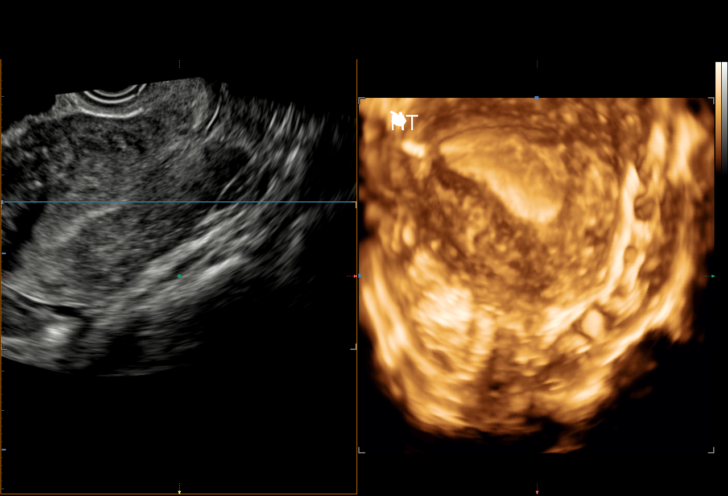

[15 of 25 positions shown; findings below may reference images not displayed]

FINDINGS: Uterus

Measurements: 8.4 x 5.0 x 5.6 cm. No fibroids or other mass
visualized.

Endometrium

Thickness: 7 mm.  No focal abnormality visualized.

Right ovary

Measurements: 2.4 x 1.5 x 2.1 cm. Normal appearance/no adnexal mass.

Left ovary

Measurements: 2.3 x 1.3 x 2.0 cm. Normal appearance/no adnexal mass.

Other findings

No abnormal free fluid.

Right Essure implant at the cornua. Left Essure implant in the
proximal fallopian tube.
IMPRESSION: Bilateral Essure implants, as above.

Otherwise negative pelvic ultrasound.
